# Patient Record
Sex: Female | Born: 1950 | Race: White | Hispanic: No | Marital: Single | Smoking: Never smoker
Health system: Southern US, Community
[De-identification: ages and names within clinical notes are randomized; demographics above are authoritative.]

## PROBLEM LIST (undated history)

## (undated) DIAGNOSIS — H9190 Unspecified hearing loss, unspecified ear: Secondary | ICD-10-CM

## (undated) DIAGNOSIS — C801 Malignant (primary) neoplasm, unspecified: Secondary | ICD-10-CM

## (undated) DIAGNOSIS — E079 Disorder of thyroid, unspecified: Secondary | ICD-10-CM

## (undated) DIAGNOSIS — I10 Essential (primary) hypertension: Secondary | ICD-10-CM

## (undated) DIAGNOSIS — F419 Anxiety disorder, unspecified: Secondary | ICD-10-CM

## (undated) DIAGNOSIS — G629 Polyneuropathy, unspecified: Secondary | ICD-10-CM

## (undated) HISTORY — PX: TUMOR REMOVAL: SHX12

## (undated) HISTORY — PX: CHOLECYSTECTOMY: SHX55

## (undated) HISTORY — PX: EYE SURGERY: SHX253

---

## 2004-12-10 ENCOUNTER — Emergency Department: Payer: Self-pay | Admitting: Emergency Medicine

## 2006-06-01 ENCOUNTER — Emergency Department: Payer: Self-pay | Admitting: Emergency Medicine

## 2006-06-21 ENCOUNTER — Emergency Department: Payer: Self-pay | Admitting: Emergency Medicine

## 2006-09-27 ENCOUNTER — Emergency Department: Payer: Self-pay | Admitting: Internal Medicine

## 2006-10-26 ENCOUNTER — Emergency Department: Payer: Self-pay | Admitting: Emergency Medicine

## 2006-12-07 ENCOUNTER — Emergency Department: Payer: Self-pay | Admitting: General Practice

## 2007-02-20 ENCOUNTER — Emergency Department: Payer: Self-pay | Admitting: Unknown Physician Specialty

## 2007-03-03 ENCOUNTER — Emergency Department: Payer: Self-pay | Admitting: Emergency Medicine

## 2007-04-26 ENCOUNTER — Emergency Department: Payer: Self-pay | Admitting: Emergency Medicine

## 2007-05-06 ENCOUNTER — Emergency Department: Payer: Self-pay | Admitting: Emergency Medicine

## 2007-05-19 ENCOUNTER — Emergency Department: Payer: Self-pay | Admitting: Emergency Medicine
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
## 2007-09-13 ENCOUNTER — Emergency Department: Payer: Self-pay | Admitting: Emergency Medicine

## 2008-03-04 ENCOUNTER — Emergency Department: Payer: Self-pay | Admitting: Emergency Medicine

## 2008-05-18 ENCOUNTER — Emergency Department: Payer: Self-pay | Admitting: Emergency Medicine
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
## 2009-01-10 ENCOUNTER — Emergency Department: Payer: Self-pay | Admitting: Emergency Medicine

## 2009-02-05 ENCOUNTER — Emergency Department: Payer: Self-pay | Admitting: Emergency Medicine

## 2009-05-08 ENCOUNTER — Emergency Department: Payer: Self-pay | Admitting: Emergency Medicine

## 2010-02-26 ENCOUNTER — Ambulatory Visit: Payer: Self-pay | Admitting: Internal Medicine

## 2010-03-04 ENCOUNTER — Emergency Department: Payer: Self-pay | Admitting: Emergency Medicine

## 2010-09-20 ENCOUNTER — Emergency Department: Payer: Self-pay | Admitting: Emergency Medicine

## 2011-03-06 ENCOUNTER — Emergency Department: Payer: Self-pay | Admitting: Emergency Medicine

## 2013-02-13 ENCOUNTER — Emergency Department: Payer: Self-pay | Admitting: Emergency Medicine

## 2013-05-02 ENCOUNTER — Emergency Department: Payer: Self-pay | Admitting: Internal Medicine

## 2013-12-23 ENCOUNTER — Emergency Department: Payer: Self-pay | Admitting: Emergency Medicine

## 2014-03-22 ENCOUNTER — Emergency Department: Payer: Self-pay | Admitting: Emergency Medicine

## 2014-06-03 ENCOUNTER — Emergency Department: Payer: Self-pay | Admitting: Emergency Medicine

## 2014-11-10 ENCOUNTER — Emergency Department: Payer: Self-pay | Admitting: Internal Medicine

## 2015-02-13 ENCOUNTER — Encounter: Payer: Self-pay | Admitting: *Deleted

## 2015-02-13 ENCOUNTER — Emergency Department
Admission: EM | Admit: 2015-02-13 | Discharge: 2015-02-13 | Disposition: A | Discharge status: Home / Self Care | Payer: Medicare Other | Attending: Emergency Medicine | Admitting: Emergency Medicine

## 2015-02-13 DIAGNOSIS — L237 Allergic contact dermatitis due to plants, except food: Secondary | ICD-10-CM | POA: Diagnosis not present

## 2015-02-13 DIAGNOSIS — X58XXXA Exposure to other specified factors, initial encounter: Secondary | ICD-10-CM | POA: Diagnosis not present

## 2015-02-13 DIAGNOSIS — Y9389 Activity, other specified: Secondary | ICD-10-CM | POA: Diagnosis not present

## 2015-02-13 DIAGNOSIS — Y9289 Other specified places as the place of occurrence of the external cause: Secondary | ICD-10-CM | POA: Insufficient documentation

## 2015-02-13 DIAGNOSIS — Y998 Other external cause status: Secondary | ICD-10-CM | POA: Insufficient documentation

## 2015-02-13 DIAGNOSIS — S0592XA Unspecified injury of left eye and orbit, initial encounter: Secondary | ICD-10-CM | POA: Diagnosis present

## 2015-02-13 DIAGNOSIS — S0502XA Injury of conjunctiva and corneal abrasion without foreign body, left eye, initial encounter: Secondary | ICD-10-CM | POA: Insufficient documentation

## 2015-02-13 MED ORDER — FLUORESCEIN SODIUM 1 MG OP STRP
ORAL_STRIP | OPHTHALMIC | Status: AC
  Filled 2015-02-13: qty 1

## 2015-02-13 MED ORDER — PREDNISONE 10 MG (21) PO TBPK: 10.0000 mg | ORAL_TABLET | Freq: Every day | ORAL | Status: DC

## 2015-02-13 MED ORDER — GENTAMICIN SULFATE 0.3 % OP SOLN: 2.0000 [drp] | OPHTHALMIC | Status: DC

## 2015-02-13 MED ORDER — TETRACAINE HCL 0.5 % OP SOLN
OPHTHALMIC | Status: AC
  Filled 2015-02-13: qty 2

## 2015-02-13 NOTE — Discharge Instructions (Signed)
Corneal Abrasion The cornea is the clear covering at the front and center of the eye. When you look at the colored portion of the eye, you are looking through the cornea. It is a thin tissue made up of layers. The top layer is the most sensitive layer. A corneal abrasion happens if this layer is scratched or an injury causes it to come off.  HOME CARE  You may be given drops or a medicated cream. Use the medicine as told by your doctor.  A pressure patch may be put over the eye. If this is done, follow your doctor's instructions for when to remove the patch. Do not drive or use machines while the eye patch is on. Judging distances is hard to do with a patch on.  See your doctor for a follow-up exam if you are told to do so. It is very important that you keep this appointment. GET HELP IF:   You have pain, are sensitive to light, and have a scratchy feeling in one eye or both eyes.  Your pressure patch keeps getting loose. You can blink your eye under the patch.  You have fluid coming from your eye or the lids stick together in the morning.  You have the same symptoms in the morning that you did with the first abrasion. This could be days, weeks, or months after the first abrasion healed. MAKE SURE YOU:   Understand these instructions.  Will watch your condition.  Will get help right away if you are not doing well or get worse. Document Released: 02/17/2008 Document Revised: 06/21/2013 Document Reviewed: 05/08/2013 [facility] Patient Information 2015 [place], [place]. This information is not intended to replace advice given to you by your health care provider. Make sure you discuss any questions you have with your health care provider.   Poison [employer] Poison ivy is a inflammation of the skin (contact dermatitis) caused by touching the allergens on the leaves of the ivy plant following previous exposure to the plant. The rash usually appears 48 hours after exposure. The rash is usually bumps  (papules) or blisters (vesicles) in a linear pattern. Depending on your own sensitivity, the rash may simply cause redness and itching, or it may also progress to blisters which may break open. These must be well cared for to prevent secondary bacterial (germ) infection, followed by scarring. Keep any open areas dry, clean, dressed, and covered with an antibacterial ointment if needed. The eyes may also get puffy. The puffiness is worst in the morning and gets better as the day progresses. This dermatitis usually heals without scarring, within 2 to 3 weeks without treatment. HOME CARE INSTRUCTIONS  Thoroughly wash with soap and water as soon as you have been exposed to poison ivy. You have about one half hour to remove the plant resin before it will cause the rash. This washing will destroy the oil or antigen on the skin that is causing, or will cause, the rash. Be sure to wash under your fingernails as any plant resin there will continue to spread the rash. Do not rub skin vigorously when washing affected area. Poison ivy cannot spread if no oil from the plant remains on your body. A rash that has progressed to weeping sores will not spread the rash unless you have not washed thoroughly. It is also important to wash any clothes you have been wearing as these may carry active allergens. The rash will return if you wear the unwashed clothing, even several days later.  Avoidance of the plant in the future is the best measure. Poison ivy plant can be recognized by the number of leaves. Generally, poison ivy has three leaves with flowering branches on a single stem. Diphenhydramine may be purchased over the counter and used as needed for itching. Do not drive with this medication if it makes you drowsy.Ask your caregiver about medication for children. SEEK MEDICAL CARE IF:  Open sores develop.  Redness spreads beyond area of rash.  You notice purulent (pus-like) discharge.  You have increased pain.  Other  signs of infection develop (such as fever). Document Released: 08/28/2000 Document Revised: 11/23/2011 Document Reviewed: 02/08/2009 [facility] Patient Information 2015 [place], [place]. This information is not intended to replace advice given to you by your health care provider. Make sure you discuss any questions you have with your health care provider.   Take the prescription steroid as directed.  Continue to use OTC Caladryl or Aveeno Ant-Itch Lotion for itch relief.  Follow-up with your provider or consider Dr. Darrick Huntsman for your primary care needs.

## 2015-02-13 NOTE — ED Notes (Signed)
Assessment per PA 

## 2015-02-13 NOTE — ED Notes (Signed)
With assistance of sign language interpreter, Pt reports yesterday she was mowing and she thinks she may have gotten a piece of wood in it, c/o redness, watery, soreness in her left eye. Pt reports for about 1 weeks rash on right arm, thinks it may be poison ivy. Has been using caladryl without relief.

## 2015-02-13 NOTE — ED Notes (Signed)
PT came in to the ED with c/o pain and burning in the left eye . She says that while she was mowing something jumped up and hit her in the eye. She states the she had pain and burning overnight but it is getting better now.she took a picture of the eye when it hit her and it looks looks red.   Pt also states that she feels a burning sensation when she looks towards her nose with the left eye.   Pt also has also has a  rash on her  Left forearm, which she thinks is due to a poison ivy and it started on Saturday and has been itching since then.

## 2015-02-14 NOTE — ED Provider Notes (Signed)
[facility] Emergency Department Provider Note ____________________________________________  Time seen: 1655  I have reviewed the triage vital signs and the nursing notes.  HISTORY  Chief Complaint Eye Injury  History limited by hearing impairment. ASL interpreter present for interview and exam.  HPI Robin Stanley is a 64 y.o. female who complains of foreign body sensation in left eye irritation after cutting the grass yesterday. She washed the eye in the shower yesterday, but continues to note burning sensation to the eye and a gritty sensation as well. She denies vision change, discharge, crusting, matting, or nausea. She also notes irritation to the right wrist for about 2 weeks after doing work and coming into contact with poison ivy.  No past medical history on file.  There are no active problems to display for this patient.  No past surgical history on file.  Current Outpatient Rx  Name  Route  Sig  Dispense  Refill  . gentamicin (GARAMYCIN) 0.3 % ophthalmic solution   Right Eye   Place 2 drops into the right eye every 4 (four) hours.   5 mL   0   . predniSONE (STERAPRED UNI-PAK 21 TAB) 10 MG (21) TBPK tablet   Oral   Take 1 tablet (10 mg total) by mouth daily.   21 tablet   0    Allergies Review of patient's allergies indicates no known allergies.  No family history on file.  Social History History  Substance Use Topics  . Smoking status: Never Smoker   . Smokeless tobacco: Not on file  . Alcohol Use: Yes   Review of Systems  Constitutional: Negative for fever. Eyes: Positive for left eye irritation.  Negative for visual changes. ENT: Negative for sore throat. Cardiovascular: Negative for chest pain. Respiratory: Negative for shortness of breath. Gastrointestinal: Negative for abdominal pain, vomiting and diarrhea. Genitourinary: Negative for dysuria. Musculoskeletal: Negative for back pain. Skin: Positive for  rash. Neurological: Negative for headaches, focal weakness or numbness. ____________________________________________  PHYSICAL EXAM:  VITAL SIGNS: ED Triage Vitals  Enc Vitals Group     BP 02/13/15 1601 161/86 mmHg     Pulse Rate 02/13/15 1601 66     Resp 02/13/15 1601 16     Temp 02/13/15 1601 97.6 F (36.4 C)     Temp Source 02/13/15 1601 Oral     SpO2 02/13/15 1601 97 %     Weight 02/13/15 1601 189 lb (85.73 kg)     Height 02/13/15 1601  (1.626 m)     Head Cir --      Peak Flow --      Pain Score 02/13/15 1602 5     Pain Loc --      Pain Edu? --      Excl. in GC? --    Constitutional: Alert and oriented. Well appearing and in no distress. HEENT: Normocephalic and atraumatic. Conjunctivae are normal. PERRL. Normal extraocular movements. Left eye with local conjunctivitis at the medial limbic border. Fluorescein dye uptake consistent with a small, focal corneal abrasion on the left eye at 7 o'clock on the cornea. No congestion/rhinnorhea. Mucous membranes are moist. Respiratory: Normal respiratory effort. Musculoskeletal: Nontender with normal range of motion in all extremities. No lower extremity tenderness nor edema. Neurologic:  No gross focal neurologic deficits are appreciated. Skin:  Skin is warm, dry and intact. Linear collection of blisters on the right wrist consistent with poison ivy. Psychiatric: Mood and affect are normal. Patient exhibits appropriate insight  and judgment. ____________________________________________  INITIAL IMPRESSION / ASSESSMENT AND PLAN / ED COURSE  Superficial corneal abrasion and contact dermatitis due to poison ivy. Treatment with antibiotics and steroids, respectively. ____________________________________________  FINAL CLINICAL IMPRESSION(S) / ED DIAGNOSES  Final diagnoses:  Corneal abrasion, left, initial encounter  Contact dermatitis due to poison ivy     Lissa HoardJenise V Bacon Nechelle Petrizzo, PA-C 02/14/15 0114  Sharman CheekPhillip Stafford,  MD 02/15/15 [phone]

## 2015-05-11 ENCOUNTER — Emergency Department
Admission: EM | Admit: 2015-05-11 | Discharge: 2015-05-11 | Disposition: A | Discharge status: Home / Self Care | Payer: Federal, State, Local not specified - PPO | Attending: Emergency Medicine | Admitting: Emergency Medicine

## 2015-05-11 ENCOUNTER — Encounter: Payer: Self-pay | Admitting: Emergency Medicine

## 2015-05-11 DIAGNOSIS — R21 Rash and other nonspecific skin eruption: Secondary | ICD-10-CM | POA: Diagnosis present

## 2015-05-11 DIAGNOSIS — L237 Allergic contact dermatitis due to plants, except food: Secondary | ICD-10-CM | POA: Insufficient documentation

## 2015-05-11 MED ORDER — CALAMINE EX LOTN: 1.0000 "application " | TOPICAL_LOTION | CUTANEOUS | Status: AC | PRN

## 2015-05-11 MED ORDER — HYDROXYZINE HCL 25 MG PO TABS: 25.0000 mg | ORAL_TABLET | Freq: Three times a day (TID) | ORAL | Status: DC | PRN

## 2015-05-11 MED ORDER — PREDNISONE 10 MG (21) PO TBPK: ORAL_TABLET | ORAL | Status: DC

## 2015-05-11 NOTE — ED Notes (Signed)
VRI used, Robin Stanley, [id number]

## 2015-05-11 NOTE — ED Provider Notes (Signed)
[facility] Emergency Department Provider Note ____________________________________________  Time seen: Approximately 12:58 PM  I have reviewed the triage vital signs and the nursing notes.   HISTORY  Chief Complaint Rash   HPI Robin Stanley is a 64 y.o. female presents to the emergency department for evaluation of a rash to the lower extremities. She states that she believes it's related to poison oak in her yard. She's been mowing and believes that she came into contact with it. She is using calamine lotion and Caladryl. She states that the Rhame works better than the calamine lotion and would like to have a prescription.   History reviewed. No pertinent past medical history.  There are no active problems to display for this patient.   History reviewed. No pertinent past surgical history.  Current Outpatient Rx  Name  Route  Sig  Dispense  Refill  . calamine lotion   Topical   Apply 1 application topically as needed for itching.   120 mL   0   . gentamicin (GARAMYCIN) 0.3 % ophthalmic solution   Right Eye   Place 2 drops into the right eye every 4 (four) hours.   5 mL   0   . hydrOXYzine (ATARAX/VISTARIL) 25 MG tablet   Oral   Take 1 tablet (25 mg total) by mouth 3 (three) times daily as needed.   30 tablet   0   . predniSONE (STERAPRED UNI-PAK 21 TAB) 10 MG (21) TBPK tablet      Take 6 tablets on day 1 Take 5 tablets on day 2 Take 4 tablets on day 3 Take 3 tablets on day 4 Take 2 tablets on day 5 Take 1 tablet on day 6   21 tablet   0     Allergies Review of patient's allergies indicates no known allergies.  No family history on file.  Social History Social History  Substance Use Topics  . Smoking status: Never Smoker   . Smokeless tobacco: None  . Alcohol Use: Yes    Review of Systems   Constitutional: No fever/chills Eyes: No visual changes. ENT: No congestion or rhinorrhea Cardiovascular: Denies  chest pain. Respiratory: Denies shortness of breath. Gastrointestinal: No abdominal pain.  No nausea, no vomiting.  No diarrhea.  No constipation. Genitourinary: Negative for dysuria. Musculoskeletal: Negative for back pain. Skin: Rash to the legs and feet Neurological: Negative for headaches, focal weakness or numbness.  10-point ROS otherwise negative.  ____________________________________________   PHYSICAL EXAM:  VITAL SIGNS: ED Triage Vitals  Enc Vitals Group     BP 05/11/15 1116 137/73 mmHg     Pulse Rate 05/11/15 1116 85     Resp 05/11/15 1116 16     Temp 05/11/15 1116 97.5 F (36.4 C)     Temp Source 05/11/15 1116 Oral     SpO2 05/11/15 1116 96 %     Weight --      Height 05/11/15 1116 5\' 4"  (1.626 m)     Head Cir --      Peak Flow --      Pain Score --      Pain Loc --      Pain Edu? --      Excl. in GC? --     Constitutional: Alert and oriented. Well appearing and in no acute distress. Eyes: Conjunctivae are normal. PERRL. EOMI. Head: Atraumatic. Nose: No congestion/rhinnorhea. Mouth/Throat: Mucous membranes are moist.  Oropharynx non-erythematous. No oral lesions. Neck: No stridor.  Cardiovascular: Normal rate, regular rhythm.  Good peripheral circulation. Respiratory: Normal respiratory effort.  No retractions. Lungs CTAB. Gastrointestinal: Soft and nontender. No distention. No abdominal bruits.  Musculoskeletal: No lower extremity tenderness nor edema.  No joint effusions. Neurologic:  Normal speech and language. No gross focal neurologic deficits are appreciated. Speech is normal. No gait instability. Skin:  Rash to bilateral feet--covered in calamine lotion and excoriated, and therefore unable to give an accurate description.; Negative for petechiae.  Psychiatric: Mood and affect are normal. Speech and behavior are normal.  ____________________________________________   LABS (all labs ordered are listed, but only abnormal results are  displayed)  Labs Reviewed - No data to display ____________________________________________  EKG   ____________________________________________  RADIOLOGY   ____________________________________________   PROCEDURES  Procedure(s) performed: None ____________________________________________   INITIAL IMPRESSION / ASSESSMENT AND PLAN / ED COURSE  Pertinent labs & imaging results that were available during my care of the patient were reviewed by me and considered in my medical decision making (see chart for details).  Patient was advised that Caladryl is an over-the-counter lotion. She was advised to follow-up with her primary care provider for symptoms that are not improving over the next several days. She was advised to return to the emergency department for symptoms that change or worsen if she is unable schedule an appointment. ____________________________________________   FINAL CLINICAL IMPRESSION(S) / ED DIAGNOSES  Final diagnoses:  Contact dermatitis due to poison oak       Chinita Pester, FNP 05/11/15 1303  Governor Rooks, MD 05/11/15 1400

## 2015-05-11 NOTE — ED Notes (Signed)
PAGED IN LOBBY. NO ANSWER. 

## 2015-05-11 NOTE — Discharge Instructions (Signed)
Contact Dermatitis °Contact dermatitis is a rash that happens when something touches the skin. You touched something that irritates your skin, or you have allergies to something you touched. °HOME CARE  °· Avoid the thing that caused your rash. °· Keep your rash away from hot water, soap, sunlight, chemicals, and other things that might bother it. °· Do not scratch your rash. °· You can take cool baths to help stop itching. °· Only take medicine as told by your doctor. °· Keep all doctor visits as told. °GET HELP RIGHT AWAY IF:  °· Your rash is not better after 3 days. °· Your rash gets worse. °· Your rash is puffy (swollen), tender, red, sore, or warm. °· You have problems with your medicine. °MAKE SURE YOU:  °· Understand these instructions. °· Will watch your condition. °· Will get help right away if you are not doing well or get worse. °Document Released: 06/28/2009 Document Revised: 11/23/2011 Document Reviewed: 02/03/2011 °ExitCare® Patient Information ©2015 [employer], [employer]. This information is not intended to replace advice given to you by your health care provider. Make sure you discuss any questions you have with your health care provider. ° °

## 2015-05-11 NOTE — ED Notes (Signed)
Patient presents to the ED with poison ivy to her legs.  Patient is requesting a prescription for poison ivy medication.  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

## 2015-09-10 ENCOUNTER — Emergency Department
Admission: EM | Admit: 2015-09-10 | Discharge: 2015-09-10 | Disposition: A | Discharge status: Home / Self Care | Payer: Federal, State, Local not specified - PPO | Attending: Emergency Medicine | Admitting: Emergency Medicine

## 2015-09-10 ENCOUNTER — Encounter: Payer: Self-pay | Admitting: Emergency Medicine

## 2015-09-10 DIAGNOSIS — Z792 Long term (current) use of antibiotics: Secondary | ICD-10-CM | POA: Diagnosis not present

## 2015-09-10 DIAGNOSIS — F419 Anxiety disorder, unspecified: Secondary | ICD-10-CM | POA: Diagnosis not present

## 2015-09-10 DIAGNOSIS — J320 Chronic maxillary sinusitis: Secondary | ICD-10-CM

## 2015-09-10 DIAGNOSIS — R0981 Nasal congestion: Secondary | ICD-10-CM | POA: Diagnosis present

## 2015-09-10 HISTORY — DX: Anxiety disorder, unspecified: F41.9

## 2015-09-10 MED ORDER — FLUTICASONE PROPIONATE 50 MCG/ACT NA SUSP: 1.0000 | Freq: Two times a day (BID) | NASAL | Status: DC

## 2015-09-10 MED ORDER — ALPRAZOLAM 0.5 MG PO TABS
0.5000 mg | ORAL_TABLET | Freq: Once | ORAL | Status: AC
  Administered 2015-09-10: 0.5 mg via ORAL
  Filled 2015-09-10: qty 1

## 2015-09-10 MED ORDER — NAPROXEN 500 MG PO TABS: 500.0000 mg | ORAL_TABLET | Freq: Two times a day (BID) | ORAL | Status: DC

## 2015-09-10 MED ORDER — CETIRIZINE HCL 10 MG PO TABS: 10.0000 mg | ORAL_TABLET | Freq: Every day | ORAL | Status: AC
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
## 2015-09-10 NOTE — Discharge Instructions (Signed)
Panic Attacks °Panic attacks are sudden, short-lived surges of severe anxiety, fear, or discomfort. They may occur for no reason when you are relaxed, when you are anxious, or when you are sleeping. Panic attacks may occur for a number of reasons:  °· Healthy people occasionally have panic attacks in extreme, life-threatening situations, such as war or natural disasters. Normal anxiety is a protective mechanism of the body that helps us react to danger (fight or flight response). °· Panic attacks are often seen with anxiety disorders, such as panic disorder, social anxiety disorder, generalized anxiety disorder, and phobias. Anxiety disorders cause excessive or uncontrollable anxiety. They may interfere with your relationships or other life activities. °· Panic attacks are sometimes seen with other mental illnesses, such as depression and posttraumatic stress disorder. °· Certain medical conditions, prescription medicines, and drugs of abuse can cause panic attacks. °SYMPTOMS  °Panic attacks start suddenly, peak within 20 minutes, and are accompanied by four or more of the following symptoms: °· Pounding heart or fast heart rate (palpitations). °· Sweating. °· Trembling or shaking. °· Shortness of breath or feeling smothered. °· Feeling choked. °· Chest pain or discomfort. °· Nausea or strange feeling in your stomach. °· Dizziness, light-headedness, or feeling like you will faint. °· Chills or hot flushes. °· Numbness or tingling in your lips or hands and feet. °· Feeling that things are not real or feeling that you are not yourself. °· Fear of losing control or going crazy. °· Fear of dying. °Some of these symptoms can mimic serious medical conditions. For example, you may think you are having a heart attack. Although panic attacks can be very scary, they are not life threatening. °DIAGNOSIS  °Panic attacks are diagnosed through an assessment by your health care provider. Your health care provider will ask  questions about your symptoms, such as where and when they occurred. Your health care provider will also ask about your medical history and use of alcohol and drugs, including prescription medicines. Your health care provider may order blood tests or other studies to rule out a serious medical condition. Your health care provider may refer you to a mental health professional for further evaluation. °TREATMENT  °· Most healthy people who have one or two panic attacks in an extreme, life-threatening situation will not require treatment. °· The treatment for panic attacks associated with anxiety disorders or other mental illness typically involves counseling with a mental health professional, medicine, or a combination of both. Your health care provider will help determine what treatment is best for you. °· Panic attacks due to physical illness usually go away with treatment of the illness. If prescription medicine is causing panic attacks, talk with your health care provider about stopping the medicine, decreasing the dose, or substituting another medicine. °· Panic attacks due to alcohol or drug abuse go away with abstinence. Some adults need professional help in order to stop drinking or using drugs. °HOME CARE INSTRUCTIONS  °· Take all medicines as directed by your health care provider.   °· Schedule and attend follow-up visits as directed by your health care provider. It is important to keep all your appointments. °SEEK MEDICAL CARE IF: °· You are not able to take your medicines as prescribed. °· Your symptoms do not improve or get worse. °SEEK IMMEDIATE MEDICAL CARE IF:  °· You experience panic attack symptoms that are different than your usual symptoms. °· You have serious thoughts about hurting yourself or others. °· You are taking medicine for panic attacks and   have a serious side effect. MAKE SURE YOU:  Understand these instructions.  Will watch your condition.  Will get help right away if you are not  doing well or get worse.   This information is not intended to replace advice given to you by your health care provider. Make sure you discuss any questions you have with your health care provider.   Document Released: 08/31/2005 Document Revised: 09/05/2013 Document Reviewed: 04/14/2013 Elsevier Interactive Patient Education 2016 Else[employer] Inc.  Sinusitis, Adult Sinusitis is redness, soreness, and inflammation of the paranasal sinuses. Paranasal sinuses are air pockets within the bones of your face. They are located beneath your eyes, in the middle of your forehead, and above your eyes. In healthy paranasal sinuses, mucus is able to drain out, and air is able to circulate through them by way of your nose. However, when your paranasal sinuses are inflamed, mucus and air can become trapped. This can allow bacteria and other germs to grow and cause infection. Sinusitis can develop quickly and last only a short time (acute) or continue over a long period (chronic). Sinusitis that lasts for more than 12 weeks is considered chronic. CAUSES Causes of sinusitis include:  Allergies.  Structural abnormalities, such as displacement of the cartilage that separates your nostrils (deviated septum), which can decrease the air flow through your nose and sinuses and affect sinus drainage.  Functional abnormalities, such as when the small hairs (cilia) that line your sinuses and help remove mucus do not work properly or are not present. SIGNS AND SYMPTOMS Symptoms of acute and chronic sinusitis are the same. The primary symptoms are pain and pressure around the affected sinuses. Other symptoms include:  Upper toothache.  Earache.  Headache.  Bad breath.  Decreased sense of smell and taste.  A cough, which worsens when you are lying flat.  Fatigue.  Fever.  Thick drainage from your nose, which often is green and may contain pus (purulent).  Swelling and warmth over the affected  sinuses. DIAGNOSIS Your health care provider will perform a physical exam. During your exam, your health care provider may perform any of the following to help determine if you have acute sinusitis or chronic sinusitis:  Look in your nose for signs of abnormal growths in your nostrils (nasal polyps).  Tap over the affected sinus to check for signs of infection.  View the inside of your sinuses using an imaging device that has a light attached (endoscope). If your health care provider suspects that you have chronic sinusitis, one or more of the following tests may be recommended:  Allergy tests.  Nasal culture. A sample of mucus is taken from your nose, sent to a lab, and screened for bacteria.  Nasal cytology. A sample of mucus is taken from your nose and examined by your health care provider to determine if your sinusitis is related to an allergy. TREATMENT Most cases of acute sinusitis are related to a viral infection and will resolve on their own within 10 days. Sometimes, medicines are prescribed to help relieve symptoms of both acute and chronic sinusitis. These may include pain medicines, decongestants, nasal steroid sprays, or saline sprays. However, for sinusitis related to a bacterial infection, your health care provider will prescribe antibiotic medicines. These are medicines that will help kill the bacteria causing the infection. Rarely, sinusitis is caused by a fungal infection. In these cases, your health care provider will prescribe antifungal medicine. For some cases of chronic sinusitis, surgery is needed. Generally, these are  cases in which sinusitis recurs more than 3 times per year, despite other treatments. HOME CARE INSTRUCTIONS  Drink plenty of water. Water helps thin the mucus so your sinuses can drain more easily.  Use a humidifier.  Inhale steam 3-4 times a day (for example, sit in the bathroom with the shower running).  Apply a warm, moist washcloth to your face  3-4 times a day, or as directed by your health care provider.  Use saline nasal sprays to help moisten and clean your sinuses.  Take medicines only as directed by your health care provider.  If you were prescribed either an antibiotic or antifungal medicine, finish it all even if you start to feel better. SEEK IMMEDIATE MEDICAL CARE IF:  You have increasing pain or severe headaches.  You have nausea, vomiting, or drowsiness.  You have swelling around your face.  You have vision problems.  You have a stiff neck.  You have difficulty breathing.   This information is not intended to replace advice given to you by your health care provider. Make sure you discuss any questions you have with your health care provider.   Document Released: 08/31/2005 Document Revised: 09/21/2014 Document Reviewed: 09/15/2011 Elsevier Interactive Patient Education [employer]

## 2015-09-10 NOTE — ED Notes (Signed)
Pt denies pain other than chronic knee pain (mild and not at this time)

## 2015-09-10 NOTE — ED Provider Notes (Signed)
[facility] Emergency Department Provider Note  ____________________________________________  Time seen: Approximately 6:51 PM  I have reviewed the triage vital signs and the nursing notes.   HISTORY  Chief Complaint Nasal Congestion  Patient is deaf and an interpreter is used.  HPI Elodie Lyndia Bury is a 64 y.o. female who presents to emergency department for complaint of nasal congestion, nasal pressure, and anxiety. Patient states that she went to [facility] to pick up over-the-counter medications to treat same. At that time she noticed somebody following her around the emergency department. Patient is deaf and is unable to communicate with other shoppers or staff at [facility]. Patient states she began to have an anxiety/panic attack and accidentally placed some of the over-the-counter medication into her purse. Patient went to the checkout line and 8 her other purchases but during departure from [place] she was approached by store security and accused of theft. She states that police were involved increasing her anxiety. Patient presents emergency department for prescription for symptoms as well as a doctor's note to explain situation so that she can get her medications filled at [facility] Patient endorses nasal congestion, sinus pressure 2 days. Patient has a history of chronic sinusitis.   Past Medical History  Diagnosis Date  . Anxiety     There are no active problems to display for this patient.   Past Surgical History  Procedure Laterality Date  . Eye surgery      Current Outpatient Rx  Name  Route  Sig  Dispense  Refill  . calamine lotion   Topical   Apply 1 application topically as needed for itching.   120 mL   0   . cetirizine (ZYRTEC) 10 MG tablet   Oral   Take 1 tablet (10 mg total) by mouth daily.   30 tablet   0   . fluticasone (FLONASE) 50 MCG/ACT nasal spray   Each Nare   Place 1 spray into both nostrils 2 (two) times daily.  16 g   0   . gentamicin (GARAMYCIN) 0.3 % ophthalmic solution   Right Eye   Place 2 drops into the right eye every 4 (four) hours.   5 mL   0   . hydrOXYzine (ATARAX/VISTARIL) 25 MG tablet   Oral   Take 1 tablet (25 mg total) by mouth 3 (three) times daily as needed.   30 tablet   0   . naproxen (NAPROSYN) 500 MG tablet   Oral   Take 1 tablet (500 mg total) by mouth 2 (two) times daily with a meal.   60 tablet   0   . predniSONE (STERAPRED UNI-PAK 21 TAB) 10 MG (21) TBPK tablet      Take 6 tablets on day 1 Take 5 tablets on day 2 Take 4 tablets on day 3 Take 3 tablets on day 4 Take 2 tablets on day 5 Take 1 tablet on day 6   21 tablet   0     Allergies Review of patient's allergies indicates no known allergies.  No family history on file.  Social History Social History  Substance Use Topics  . Smoking status: Never Smoker   . Smokeless tobacco: None  . Alcohol Use: Yes    Review of Systems Constitutional: No fever/chills Eyes: No visual changes. ENT: No sore throat. Endorses nasal congestion. Endorses sinus pressure. Cardiovascular: Denies chest pain. Respiratory: Denies shortness of breath. Gastrointestinal: No abdominal pain.  No nausea, no vomiting.  No diarrhea.  No constipation. Genitourinary: Negative for dysuria. Musculoskeletal: Negative for back pain. Skin: Negative for rash. Neurological: Negative for headaches, focal weakness or numbness.  10-point ROS otherwise negative.  ____________________________________________   PHYSICAL EXAM:  VITAL SIGNS: ED Triage Vitals  Enc Vitals Group     BP 09/10/15 1737 158/83 mmHg     Pulse Rate 09/10/15 1737 76     Resp 09/10/15 1737 20     Temp 09/10/15 1737 97.6 F (36.4 C)     Temp Source 09/10/15 1737 Oral     SpO2 09/10/15 1737 98 %     Weight 09/10/15 1737 170 lb (77.111 kg)     Height 09/10/15 1737 5\' 3"  (1.6 m)     Head Cir --      Peak Flow --      Pain Score --      Pain Loc --       Pain Edu? --      Excl. in GC? --     Constitutional: Alert and oriented. Well appearing and in no acute distress. Eyes: Conjunctivae are normal. PERRL. EOMI. Head: Atraumatic. Nose: Mild clear congestion/rhinnorhea. Turbinates are boggy. Frontal and maxillary sinuses are not tender to percussion. Mouth/Throat: Mucous membranes are moist.  Oropharynx non-erythematous. Neck: No stridor.   Hematological/Lymphatic/Immunilogical: No cervical lymphadenopathy. Cardiovascular: Normal rate, regular rhythm. Grossly normal heart sounds.  Good peripheral circulation. Respiratory: Normal respiratory effort.  No retractions. Lungs CTAB. Gastrointestinal: Soft and nontender. No distention. No abdominal bruits. No CVA tenderness. Musculoskeletal: No lower extremity tenderness nor edema.  No joint effusions. Neurologic:  Normal speech and language. No gross focal neurologic deficits are appreciated. No gait instability. Skin:  Skin is warm, dry and intact. No rash noted. Psychiatric: Mood and affect are normal. Speech and behavior are normal.  ____________________________________________   LABS (all labs ordered are listed, but only abnormal results are displayed)  Labs Reviewed - No data to display ____________________________________________  EKG   ____________________________________________  RADIOLOGY   ____________________________________________   PROCEDURES  Procedure(s) performed: None  Critical Care performed: No   Medications  ALPRAZolam (XANAX) tablet 0.5 mg (0.5 mg Oral Given 09/10/15 1923)    ____________________________________________   INITIAL IMPRESSION / ASSESSMENT AND PLAN / ED COURSE  Pertinent labs & imaging results that were available during my care of the patient were reviewed by me and considered in my medical decision making (see chart for details).  Patient presents emergency department with a complaint of sinus pressure, congestion, and anxiety.  [facility] to obtain over-the-counter medications. Patient felt threatened in the store and accidentally put one bottle medication in her purse. Patient was confronted by store security as well as [employer]. Patient is having anxiety from the incident that [employer]. Patient is given 0.5 mg of Xanax here in the emergency department with good symptomatic control. Patient will be given prescriptions for chronic sinusitis. There is no signs of infection at this time. [facility] also [facility] along [facility] prescriptions. Patient verbalizes understanding of her diagnosis, the treatment plan, and plan to include note for [facility].   New Prescriptions   CETIRIZINE (ZYRTEC) 10 MG TABLET    Take 1 tablet (10 mg total) by mouth daily.   FLUTICASONE (FLONASE) 50 MCG/ACT NASAL SPRAY    Place 1 spray into both nostrils 2 (two) times daily.   NAPROXEN (NAPROSYN) 500 MG TABLET    Take  1 tablet (500 mg total) by mouth 2 (two) times daily with a meal.    ____________________________________________   FINAL CLINICAL IMPRESSION(S) / ED DIAGNOSES  Final diagnoses:  Chronic maxillary sinusitis  Anxiety      Racheal Patches, PA-C 09/10/15 1956  Loleta Rose, MD 09/10/15 2111

## 2015-09-10 NOTE — ED Notes (Signed)
Per  Interpreter Jason NestJarvis # [phone] RID.   Pt states has had nasal congestion started yesterday. Went to [facility] today to get some medicine and while she was shopping she states a man was following her around and she got scared so she accidentally put some medicines into her purse without thinking and security approached her accusing her of stealing. Pt states they were unable to communicate with her due to  Not having an interpreter present and that made her very upset causing her to get very anxious. So pt came here to get a note saying that she needed medicines for sinuses so that [facility] does not think she was trying to steal.  Pt denies any thoughts of SI or HI. Pt with no acute distress noted.

## 2015-10-22 ENCOUNTER — Emergency Department
Admission: EM | Admit: 2015-10-22 | Discharge: 2015-10-22 | Disposition: A | Discharge status: Home / Self Care | Payer: Federal, State, Local not specified - PPO | Attending: Emergency Medicine | Admitting: Emergency Medicine

## 2015-10-22 ENCOUNTER — Encounter: Payer: Self-pay | Admitting: Emergency Medicine

## 2015-10-22 DIAGNOSIS — Z792 Long term (current) use of antibiotics: Secondary | ICD-10-CM | POA: Diagnosis not present

## 2015-10-22 DIAGNOSIS — Z7952 Long term (current) use of systemic steroids: Secondary | ICD-10-CM | POA: Insufficient documentation

## 2015-10-22 DIAGNOSIS — Z791 Long term (current) use of non-steroidal anti-inflammatories (NSAID): Secondary | ICD-10-CM | POA: Insufficient documentation

## 2015-10-22 DIAGNOSIS — Z79899 Other long term (current) drug therapy: Secondary | ICD-10-CM | POA: Insufficient documentation

## 2015-10-22 DIAGNOSIS — B349 Viral infection, unspecified: Secondary | ICD-10-CM | POA: Diagnosis not present

## 2015-10-22 DIAGNOSIS — J029 Acute pharyngitis, unspecified: Secondary | ICD-10-CM | POA: Diagnosis present

## 2015-10-22 DIAGNOSIS — H9391 Unspecified disorder of right ear: Secondary | ICD-10-CM | POA: Diagnosis not present

## 2015-10-22 DIAGNOSIS — B9789 Other viral agents as the cause of diseases classified elsewhere: Secondary | ICD-10-CM

## 2015-10-22 DIAGNOSIS — J028 Acute pharyngitis due to other specified organisms: Secondary | ICD-10-CM

## 2015-10-22 LAB — POCT RAPID STREP A: STREPTOCOCCUS, GROUP A SCREEN (DIRECT): NEGATIVE

## 2015-10-22 MED ORDER — FLUTICASONE PROPIONATE 50 MCG/ACT NA SUSP: 2.0000 | Freq: Every day | NASAL | Status: DC

## 2015-10-22 MED ORDER — MAGIC MOUTHWASH W/LIDOCAINE: 5.0000 mL | Freq: Four times a day (QID) | ORAL | Status: DC | PRN

## 2015-10-22 NOTE — ED Notes (Signed)
Pt arrived to the ED for complaints of sore throat, runny nose and cough without relieve with medication. Pt is AOx4 in no apparent distress. Pt is deaf and will require an interpreter.

## 2015-10-22 NOTE — ED Notes (Signed)
Pt discharged to home.  Friend driving.  Discharge instructions reviewed.  Verbalized understanding.  No questions or concerns at this time.  Teach back verified.  Pt in NAD.  No items left in ED.   

## 2015-10-22 NOTE — ED Provider Notes (Signed)
[facility] Emergency Department Provider Note  ____________________________________________  Time seen: Approximately 9:39 PM  I have reviewed the triage vital signs and the nursing notes.   HISTORY  Chief Complaint Cough; Sore Throat; and Nasal Congestion  Patient is deaf, ASL interpreter assisted throughout the visit  HPI Robin Stanley is a 65 y.o. female, NAD, presents to the emergency department with 3 day history of sore throat, subjective fevers, runny nose and congestion. Took 1000 mg Tylenol which assisted with sore throat. Has had mild cough but no pain with cough. Denies body aches. No known sick contacts.   Past Medical History  Diagnosis Date  . Anxiety     There are no active problems to display for this patient.   Past Surgical History  Procedure Laterality Date  . Eye surgery    . Cholecystectomy    . Tumor removal      Current Outpatient Rx  Name  Route  Sig  Dispense  Refill  . calamine lotion   Topical   Apply 1 application topically as needed for itching.   120 mL   0   . cetirizine (ZYRTEC) 10 MG tablet   Oral   Take 1 tablet (10 mg total) by mouth daily.   30 tablet   0   . fluticasone (FLONASE) 50 MCG/ACT nasal spray   Each Nare   Place 2 sprays into both nostrils daily.   16 g   0   . gentamicin (GARAMYCIN) 0.3 % ophthalmic solution   Right Eye   Place 2 drops into the right eye every 4 (four) hours.   5 mL   0   . hydrOXYzine (ATARAX/VISTARIL) 25 MG tablet   Oral   Take 1 tablet (25 mg total) by mouth 3 (three) times daily as needed.   30 tablet   0   . magic mouthwash w/lidocaine SOLN   Oral   Take 5 mLs by mouth 4 (four) times daily as needed for mouth pain.   240 mL   0     Please mix 60mL diphenhydramine, 60mL nystatin, 60 ...   . naproxen (NAPROSYN) 500 MG tablet   Oral   Take 1 tablet (500 mg total) by mouth 2 (two) times daily with a meal.   60 tablet   0   . predniSONE  (STERAPRED UNI-PAK 21 TAB) 10 MG (21) TBPK tablet      Take 6 tablets on day 1 Take 5 tablets on day 2 Take 4 tablets on day 3 Take 3 tablets on day 4 Take 2 tablets on day 5 Take 1 tablet on day 6   21 tablet   0     Allergies Review of patient's allergies indicates no known allergies.  History reviewed. No pertinent family history.  Social History Social History  Substance Use Topics  . Smoking status: Never Smoker   . Smokeless tobacco: None  . Alcohol Use: Yes     Review of Systems  Constitutional: The subjective fever. Positive chills Eyes: No visual changes. No discharge ENT: Positive sore throat, nasal congestion, runny nose. Cardiovascular: No chest pain. Respiratory: Positive cough. No shortness of breath. No wheezing. No chest congestion Musculoskeletal: Negative for myalgias Skin: Negative for rash. Neurological: Negative for headaches, focal weakness or numbness. 10-point ROS otherwise negative.  ____________________________________________   PHYSICAL EXAM:  VITAL SIGNS: ED Triage Vitals  Enc Vitals Group     BP 10/22/15 2035 142/95 mmHg  Pulse Rate 10/22/15 2035 85     Resp 10/22/15 2035 18     Temp 10/22/15 2035 98.6 F (37 C)     Temp Source 10/22/15 2035 Oral     SpO2 10/22/15 2035 97 %     Weight 10/22/15 2035 195 lb (88.451 kg)     Height 10/22/15 2035  (1.626 m)     Head Cir --      Peak Flow --      Pain Score 10/22/15 2042 10     Pain Loc --      Pain Edu? --      Excl. in GC? --     Constitutional: Alert and oriented. Well appearing and in no acute distress. Eyes: Conjunctivae are normal. PERRL. EOMI without pain.  Head: Atraumatic. ENT:      Ears: Right TM visualized with moderate serous effusion, no erythema, no bulging/retractions and without perforation. TMs visualized with no effusion, erythema, bulging/retractions and without perforation. Bilateral canals without erythema, swelling, discharge      Nose: Bilateral  trace clear rhinorrhea noted.      Mouth/Throat: Mucous membranes are moist. Clear postnasal drip. Mild pharyngeal injection without swelling, exudates Neck: Upper with full range of motion Hematological/Lymphatic/Immunilogical: No cervical lymphadenopathy. Cardiovascular: Normal rate, regular rhythm. Normal S1 and S2.   Respiratory: Normal respiratory effort without tachypnea or retractions. Lungs CTAB. Skin:  Skin is warm, dry and intact. No rash noted. Psychiatric: Mood and affect are normal.  ____________________________________________   LABS (all labs ordered are listed, but only abnormal results are displayed)  Labs Reviewed  POCT RAPID STREP A   ____________________________________________  EKG  None ____________________________________________  RADIOLOGY  None ____________________________________________    PROCEDURES  Procedure(s) performed: None    Medications - No data to display   ____________________________________________   INITIAL IMPRESSION / ASSESSMENT AND PLAN / ED COURSE  Pertinent lab results that were available during my care of the patient were reviewed by me and considered in my medical decision making (see chart for details).  Patient's diagnosis is consistent with viral infection. Patient will be discharged home with prescriptions for mouthwash to use 1 teaspoon swish and gargle as needed for throat pain. Patient is to follow up with her primary care provider if symptoms persist past this treatment course. Patient is given ED precautions to return to the ED for any worsening or new symptoms.    ____________________________________________  FINAL CLINICAL IMPRESSION(S) / ED DIAGNOSES  Final diagnoses:  Viral infection  Sore throat (viral)      NEW MEDICATIONS STARTED DURING THIS VISIT:  New Prescriptions   FLUTICASONE (FLONASE) 50 MCG/ACT NASAL SPRAY    Place 2 sprays into both nostrils daily.   MAGIC MOUTHWASH W/LIDOCAINE  SOLN    Take 5 mLs by mouth 4 (four) times daily as needed for mouth pain.         Hope Pigeon, PA-C 10/22/15 2228  Myrna Blazer, MD 10/22/15 [phone]

## 2015-10-22 NOTE — Discharge Instructions (Signed)
Pharyngitis °Pharyngitis is a sore throat (pharynx). There is redness, pain, and swelling of your throat. °HOME CARE  °· Drink enough fluids to keep your pee (urine) clear or pale yellow. °· Only take medicine as told by your doctor. °· You may get sick again if you do not take medicine as told. Finish your medicines, even if you start to feel better. °· Do not take aspirin. °· Rest. °· Rinse your mouth (gargle) with salt water (½ tsp of salt per 1 qt of water) every 1-2 hours. This will help the pain. °· If you are not at risk for choking, you can suck on hard candy or sore throat lozenges. °GET HELP IF: °· You have large, tender lumps on your neck. °· You have a rash. °· You cough up green, yellow-brown, or bloody spit. °GET HELP RIGHT AWAY IF:  °· You have a stiff neck. °· You drool or cannot swallow liquids. °· You throw up (vomit) or are not able to keep medicine or liquids down. °· You have very bad pain that does not go away with medicine. °· You have problems breathing (not from a stuffy nose). °MAKE SURE YOU:  °· Understand these instructions. °· Will watch your condition. °· Will get help right away if you are not doing well or get worse. °  °This information is not intended to replace advice given to you by your health care provider. Make sure you discuss any questions you have with your health care provider. °  °Document Released: 02/17/2008 Document Revised: 06/21/2013 Document Reviewed: 05/08/2013 °Elsevier Interactive Patient Education ©2016 Elsevier Inc. °Viral Infections °A viral infection can be caused by different types of viruses. Most viral infections are not serious and resolve on their own. However, some infections may cause severe symptoms and may lead to further complications. °SYMPTOMS °Viruses can frequently cause: °· Minor sore throat. °· Aches and pains. °· Headaches. °· Runny nose. °· Different types of rashes. °· Watery eyes. °· Tiredness. °· Cough. °· Loss of  appetite. °· Gastrointestinal infections, resulting in nausea, vomiting, and diarrhea. °These symptoms do not respond to antibiotics because the infection is not caused by bacteria. However, you might catch a bacterial infection following the viral infection. This is sometimes called a "superinfection." Symptoms of such a bacterial infection may include: °· Worsening sore throat with pus and difficulty swallowing. °· Swollen neck glands. °· Chills and a high or persistent fever. °· Severe headache. °· Tenderness over the sinuses. °· Persistent overall ill feeling (malaise), muscle aches, and tiredness (fatigue). °· Persistent cough. °· Yellow, green, or brown mucus production with coughing. °HOME CARE INSTRUCTIONS  °· Only take over-the-counter or prescription medicines for pain, discomfort, diarrhea, or fever as directed by your caregiver. °· Drink enough water and fluids to keep your urine clear or pale yellow. Sports drinks can provide valuable electrolytes, sugars, and hydration. °· Get plenty of rest and maintain proper nutrition. Soups and broths with crackers or rice are fine. °SEEK IMMEDIATE MEDICAL CARE IF:  °· You have severe headaches, shortness of breath, chest pain, neck pain, or an unusual rash. °· You have uncontrolled vomiting, diarrhea, or you are unable to keep down fluids. °· You or your child has an oral temperature above 102° F (38.9° C), not controlled by medicine. °· Your baby is older than 3 months with a rectal temperature of 102° F (38.9° C) or higher. °· Your baby is 3 months old or younger with a rectal temperature of 100.4° F (  F (38 C) or higher. MAKE SURE YOU:   Understand these instructions.  Will watch your condition.  Will get help right away if you are not doing well or get worse.   This information is not intended to replace advice given to you by your health care provider. Make sure you discuss any questions you have with your health care provider.   Document Released:  06/10/2005 Document Revised: 11/23/2011 Document Reviewed: 02/06/2015 Elsevier Interactive Patient Education [employer]

## 2016-01-25 ENCOUNTER — Emergency Department: Payer: Federal, State, Local not specified - PPO

## 2016-01-25 ENCOUNTER — Emergency Department
Admission: EM | Admit: 2016-01-25 | Discharge: 2016-01-25 | Disposition: A | Discharge status: Home / Self Care | Payer: Federal, State, Local not specified - PPO | Attending: Emergency Medicine | Admitting: Emergency Medicine

## 2016-01-25 ENCOUNTER — Encounter: Payer: Self-pay | Admitting: Emergency Medicine

## 2016-01-25 DIAGNOSIS — M19072 Primary osteoarthritis, left ankle and foot: Secondary | ICD-10-CM | POA: Diagnosis not present

## 2016-01-25 DIAGNOSIS — L258 Unspecified contact dermatitis due to other agents: Secondary | ICD-10-CM | POA: Insufficient documentation

## 2016-01-25 DIAGNOSIS — L255 Unspecified contact dermatitis due to plants, except food: Secondary | ICD-10-CM

## 2016-01-25 DIAGNOSIS — M79672 Pain in left foot: Secondary | ICD-10-CM | POA: Diagnosis present

## 2016-01-25 MED ORDER — MELOXICAM 7.5 MG PO TABS: 15.0000 mg | ORAL_TABLET | Freq: Every day | ORAL | Status: DC

## 2016-01-25 MED ORDER — HYDROXYZINE PAMOATE 25 MG PO CAPS: 25.0000 mg | ORAL_CAPSULE | Freq: Three times a day (TID) | ORAL | Status: DC | PRN

## 2016-01-25 NOTE — ED Notes (Signed)
L foot pain. States has history of same but worse after mowing lawn. Also poison ivy rash chest x 2 weeks.

## 2016-01-25 NOTE — Discharge Instructions (Signed)
Arthritis Arthritis is a term that is commonly used to refer to joint pain or joint disease. There are more than 100 types of arthritis. CAUSES The most common cause of this condition is wear and tear of a joint. Other causes include:  Gout.  Inflammation of a joint.  An infection of a joint.  Sprains and other injuries near the joint.  A drug reaction or allergic reaction. In some cases, the cause may not be known. SYMPTOMS The main symptom of this condition is pain in the joint with movement. Other symptoms include:  Redness, swelling, or stiffness at a joint.  Warmth coming from the joint.  Fever.  Overall feeling of illness. DIAGNOSIS This condition may be diagnosed with a physical exam and tests, including:  Blood tests.  Urine tests.  Imaging tests, such as MRI, X-rays, or a CT scan. Sometimes, fluid is removed from a joint for testing. TREATMENT Treatment for this condition may involve:  Treatment of the cause, if it is known.  Rest.  Raising (elevating) the joint.  Applying cold or hot packs to the joint.  Medicines to improve symptoms and reduce inflammation.  Injections of a steroid such as cortisone into the joint to help reduce pain and inflammation. Depending on the cause of your arthritis, you may need to make lifestyle changes to reduce stress on your joint. These changes may include exercising more and losing weight. HOME CARE INSTRUCTIONS Medicines  Take over-the-counter and prescription medicines only as told by your health care provider.  Do not take aspirin to relieve pain if gout is suspected. Activities  Rest your joint if told by your health care provider. Rest is important when your disease is active and your joint feels painful, swollen, or stiff.  Avoid activities that make the pain worse. It is important to balance activity with rest.  Exercise your joint regularly with range-of-motion exercises as told by your health care  provider. Try doing low-impact exercise, such as:  Swimming.  Water aerobics.  Biking.  Walking. Joint Care  If your joint is swollen, keep it elevated if told by your health care provider.  If your joint feels stiff in the morning, try taking a warm shower.  If directed, apply heat to the joint. If you have diabetes, do not apply heat without permission from your health care provider.  Put a towel between the joint and the hot pack or heating pad.  Leave the heat on the area for 20-30 minutes.  If directed, apply ice to the joint:  Put ice in a plastic bag.  Place a towel between your skin and the bag.  Leave the ice on for 20 minutes, 2-3 times per day.  Keep all follow-up visits as told by your health care provider. This is important. SEEK MEDICAL CARE IF:  The pain gets worse.  You have a fever. SEEK IMMEDIATE MEDICAL CARE IF:  You develop severe joint pain, swelling, or redness.  Many joints become painful and swollen.  You develop severe back pain.  You develop severe weakness in your leg.  You cannot control your bladder or bowels.   This information is not intended to replace advice given to you by your health care provider. Make sure you discuss any questions you have with your health care provider.   Document Released: 10/08/2004 Document Revised: 05/22/2015 Document Reviewed: 11/26/2014 Elsevier Interactive Patient Education 2016 [employer].  [employer] Poison ivy is a rash caused by touching the leaves of the  poison ivy plant. The rash often shows up 48 hours later. You might just have bumps, redness, and itching. Sometimes, blisters appear and break open. Your eyes may get puffy (swollen). Poison ivy often heals in 2 to 3 weeks without treatment. HOME CARE  If you touch poison ivy:  Wash your skin with soap and water right away. Wash under your fingernails. Do not rub the skin very hard.  Wash any clothes you were wearing.  Avoid poison  ivy in the future. Poison ivy has 3 leaves on a stem.  Use medicine to help with itching as told by your doctor. Do not drive when you take this medicine.  Keep open sores dry, clean, and covered with a bandage and medicated cream, if needed.  Ask your doctor about medicine for children. GET HELP RIGHT AWAY IF:  You have open sores.  Redness spreads beyond the area of the rash.  There is yellowish white fluid (pus) coming from the rash.  Pain gets worse.  You have a temperature by mouth above 102 F (38.9 C), not controlled by medicine. MAKE SURE YOU:  Understand these instructions.  Will watch your condition.  Will get help right away if you are not doing well or get worse.   This information is not intended to replace advice given to you by your health care provider. Make sure you discuss any questions you have with your health care provider.   Document Released: 10/03/2010 Document Revised: 11/23/2011 Document Reviewed: 02/06/2015 Elsevier Interactive Patient Education [employer] Elsevier Inc.

## 2016-01-25 NOTE — ED Provider Notes (Signed)
[facility] Emergency Department Provider Note  ____________________________________________  Time seen: Approximately 2:04 PM  I have reviewed the triage vital signs and the nursing notes.   HISTORY  Chief Complaint Foot Pain and Rash  History, physical exam and discharge was completed in the presence of an American sign language interpreter via electronic platform.  HPI Robin Stanley is a 65 y.o. female , NAD, presents to the emergency department with one-day history of left foot pain and 2 week history of chest rash. Patient states she has had off and on left foot pain for some time. States it gets worse with activity. Also notes at its worse in the mornings and presents to the day. Can feel like a burning. Has not had any injuries, traumas, falls to injure the foot. Denies any bruising, swelling, redness, skin sores. Also notes that she has had in itchy rash about her chest for about 2 weeks. States her son was working in her yard and she feels she was exposed to poison ivy. Has been using an over-the-counter cream which has seemed to help but she continues to itch. Denies any rash about any other part of her body. Has not had a difficulty breathing or swallowing due to the exposure.   Past Medical History  Diagnosis Date  . Anxiety     There are no active problems to display for this patient.   Past Surgical History  Procedure Laterality Date  . Eye surgery    . Cholecystectomy    . Tumor removal      Current Outpatient Rx  Name  Route  Sig  Dispense  Refill  . calamine lotion   Topical   Apply 1 application topically as needed for itching.   120 mL   0   . cetirizine (ZYRTEC) 10 MG tablet   Oral   Take 1 tablet (10 mg total) by mouth daily.   30 tablet   0   . fluticasone (FLONASE) 50 MCG/ACT nasal spray   Each Nare   Place 2 sprays into both nostrils daily.   16 g   0   . gentamicin (GARAMYCIN) 0.3 % ophthalmic solution    Right Eye   Place 2 drops into the right eye every 4 (four) hours.   5 mL   0   . hydrOXYzine (ATARAX/VISTARIL) 25 MG tablet   Oral   Take 1 tablet (25 mg total) by mouth 3 (three) times daily as needed.   30 tablet   0   . hydrOXYzine (VISTARIL) 25 MG capsule   Oral   Take 1 capsule (25 mg total) by mouth 3 (three) times daily as needed.   21 capsule   0   . magic mouthwash w/lidocaine SOLN   Oral   Take 5 mLs by mouth 4 (four) times daily as needed for mouth pain.   240 mL   0     Please mix 60mL diphenhydramine, 60mL nystatin, 60 ...   . meloxicam (MOBIC) 7.5 MG tablet   Oral   Take 2 tablets (15 mg total) by mouth daily.   7 tablet   0   . naproxen (NAPROSYN) 500 MG tablet   Oral   Take 1 tablet (500 mg total) by mouth 2 (two) times daily with a meal.   60 tablet   0   . predniSONE (STERAPRED UNI-PAK 21 TAB) 10 MG (21) TBPK tablet      Take 6 tablets on day 1  Take 5 tablets on day 2 Take 4 tablets on day 3 Take 3 tablets on day 4 Take 2 tablets on day 5 Take 1 tablet on day 6   21 tablet   0     Allergies Review of patient's allergies indicates no known allergies.  No family history on file.  Social History Social History  Substance Use Topics  . Smoking status: Never Smoker   . Smokeless tobacco: None  . Alcohol Use: Yes     Review of Systems  Constitutional: No fever/chills ENT: No sore throat, Throat swelling, difficulty swallowing. Cardiovascular: No chest pain. Respiratory: No cough. No shortness of breath. No wheezing.  Musculoskeletal: Positive left foot pain. Skin: Positive for rash. No redness, swelling, skin sores. Neurological: Negative for headaches, focal weakness or numbness. No tingling   ____________________________________________   PHYSICAL EXAM:  VITAL SIGNS: ED Triage Vitals  Enc Vitals Group     BP 01/25/16 1242 156/70 mmHg     Pulse Rate 01/25/16 1242 66     Resp 01/25/16 1242 18     Temp 01/25/16 1242 98  F (36.7 C)     Temp Source 01/25/16 1242 Oral     SpO2 01/25/16 1242 99 %     Weight 01/25/16 1242 190 lb (86.183 kg)     Height 01/25/16 1242 5\' 4"  (1.626 m)     Head Cir --      Peak Flow --      Pain Score --      Pain Loc --      Pain Edu? --      Excl. in GC? --      Constitutional: Alert and oriented. Well appearing and in no acute distress. Eyes: Conjunctivae are normal.  Head: Atraumatic. Cardiovascular: Good peripheral circulation with 2+ pulses noted in the left lower extremity. Capillary refill is brisk in the digits of the left lower extremity Respiratory: Normal respiratory effort without tachypnea or retractions.  Musculoskeletal: Mild tenderness to palpation about the proximal, dorsal aspect of the left foot but without any bony abnormalities nor crepitus to palpation. Full range of motion of the left ankle, foot, toes without pain. No lower extremity tenderness nor edema.  No joint effusions. Neurologic:  Normal speech and language. No gross focal neurologic deficits are appreciated.  Skin:  Skin is warm, dry and intact. No rash noted about the chest. Psychiatric: Mood and affect are normal. Behavior is normal. Patient exhibits appropriate insight and judgement.   ____________________________________________   LABS  None ____________________________________________  EKG  None ____________________________________________  RADIOLOGY I have personally viewed and evaluated these images (plain radiographs) as part of my medical decision making, as well as reviewing the written report by the radiologist.  Dg Foot Complete Left  01/25/2016  CLINICAL DATA:  Chronic left foot pain worse in small and lawn yesterday. Lump on dorsal foot adjacent navicular. EXAM: LEFT FOOT - COMPLETE 3+ VIEW COMPARISON:  None. FINDINGS: Examination demonstrates mild degenerative changes over the midfoot, first MTP joint and interphalangeal joints. Small inferior calcaneal spur. No acute  fracture or dislocation. IMPRESSION: No acute findings. Electronically Signed   By: Elberta Fortisaniel  Boyle M.D.   On: 01/25/2016 14:15    ____________________________________________    PROCEDURES  Procedure(s) performed: None    Medications - No data to display   ____________________________________________   INITIAL IMPRESSION / ASSESSMENT AND PLAN / ED COURSE  Pertinent imaging results that were available during my care of the patient were reviewed  by me and considered in my medical decision making (see chart for details).  Patient's diagnosis is consistent with arthritis of the left foot and contact dermatitis due to plants that is resolving. Patient will be discharged home with prescriptions for Motrin and hydroxyzine to take as directed. Patient is to follow up with Dr. Orland Jarred in podiatry as needed for recurring foot pain. Patient may follow up with her primary care provider in regards to any recurrence of her rash. Patient is given ED precautions to return to the ED for any worsening or new symptoms.      ____________________________________________  FINAL CLINICAL IMPRESSION(S) / ED DIAGNOSES  Final diagnoses:  Arthritis of left foot  Contact dermatitis due to plant      NEW MEDICATIONS STARTED DURING THIS VISIT:  Discharge Medication List as of 01/25/2016  2:34 PM    START taking these medications   Details  hydrOXYzine (VISTARIL) 25 MG capsule Take 1 capsule (25 mg total) by mouth 3 (three) times daily as needed., Starting 01/25/2016, Until Discontinued, Print    meloxicam (MOBIC) 7.5 MG tablet Take 2 tablets (15 mg total) by mouth daily., Starting 01/25/2016, Until Discontinued, Print             Hope Pigeon, PA-C 01/25/16 1516  Myrna Blazer, MD 01/25/16 [phone]

## 2016-04-10 ENCOUNTER — Encounter: Payer: Self-pay | Admitting: Emergency Medicine

## 2016-04-10 ENCOUNTER — Emergency Department
Admission: EM | Admit: 2016-04-10 | Discharge: 2016-04-10 | Disposition: A | Discharge status: Home / Self Care | Payer: Medicare Other | Attending: Emergency Medicine | Admitting: Emergency Medicine

## 2016-04-10 DIAGNOSIS — L237 Allergic contact dermatitis due to plants, except food: Secondary | ICD-10-CM

## 2016-04-10 DIAGNOSIS — Z791 Long term (current) use of non-steroidal anti-inflammatories (NSAID): Secondary | ICD-10-CM | POA: Insufficient documentation

## 2016-04-10 DIAGNOSIS — L255 Unspecified contact dermatitis due to plants, except food: Secondary | ICD-10-CM | POA: Insufficient documentation

## 2016-04-10 MED ORDER — TRIAMCINOLONE ACETONIDE 0.1 % EX CREA: 1.0000 "application " | TOPICAL_CREAM | Freq: Four times a day (QID) | CUTANEOUS | 1 refills | Status: DC

## 2016-04-10 MED ORDER — TRIAMCINOLONE ACETONIDE 40 MG/ML IJ SUSP
40.0000 mg | Freq: Once | INTRAMUSCULAR | Status: AC
  Administered 2016-04-10: 40 mg via INTRAMUSCULAR
  Filled 2016-04-10: qty 1

## 2016-04-10 MED ORDER — PREDNISONE 20 MG PO TABS: 20.0000 mg | ORAL_TABLET | Freq: Every day | ORAL | 0 refills | Status: AC

## 2016-04-10 NOTE — ED Notes (Signed)
Pharmacy notified to send kenalog injection.

## 2016-04-10 NOTE — ED Triage Notes (Signed)
Pt in via triage with complaints of recurrent poison ivy.  Pt reports having to mow her sons lawn for a couple of months because he has moved recently.  Pt reports being seen previously and prescribed an oral medication which she does not recall the name of but has ran out of it.  Pt complains of itching, tingling.  Pt with poison ivy to bilateral feet and lower legs.  Pt A/Ox4, no immediate distress noted.  Pt is deaf, mobile interpreter at bedside.

## 2016-04-10 NOTE — ED Provider Notes (Signed)
[facility] Emergency Department Provider Note  ____________________________________________  Time seen: Approximately 6:31 PM  I have reviewed the triage vital signs and the nursing notes.   HISTORY  Chief Complaint Poison Ivy  Patient is deaf and ASL sign language interpreter is used  HPI Robin Stanley is a 65 y.o. female who presents to emergency department complaining of a rash to bilateral lower extremities. Patient states that she has known contacts with poison ivy and has had repeated episodes of contact dermatitis from same. Patient is requesting medication to clear up poison ivy.No other symptoms or complaints. She is tried calamine lotion with minimal relief. Symptoms are described as "itchy."   Past Medical History:  Diagnosis Date  . Anxiety     There are no active problems to display for this patient.   Past Surgical History:  Procedure Laterality Date  . CHOLECYSTECTOMY    . EYE SURGERY    . TUMOR REMOVAL      Prior to Admission medications   Medication Sig Start Date End Date Taking? Authorizing Provider  calamine lotion Apply 1 application topically as needed for itching. 05/11/15   Chinita Pester, FNP  cetirizine (ZYRTEC) 10 MG tablet Take 1 tablet (10 mg total) by mouth daily. 09/10/15   Delorise Royals Cuthriell, PA-C  fluticasone (FLONASE) 50 MCG/ACT nasal spray Place 2 sprays into both nostrils daily. 10/22/15   Jami L Hagler, PA-C  gentamicin (GARAMYCIN) 0.3 % ophthalmic solution Place 2 drops into the right eye every 4 (four) hours. 02/13/15   Jenise V Bacon Menshew, PA-C  hydrOXYzine (ATARAX/VISTARIL) 25 MG tablet Take 1 tablet (25 mg total) by mouth 3 (three) times daily as needed. 05/11/15   Chinita Pester, FNP  hydrOXYzine (VISTARIL) 25 MG capsule Take 1 capsule (25 mg total) by mouth 3 (three) times daily as needed. 01/25/16   Jami L Hagler, PA-C  magic mouthwash w/lidocaine SOLN Take 5 mLs by mouth 4 (four) times daily as  needed for mouth pain. 10/22/15   Jami L Hagler, PA-C  meloxicam (MOBIC) 7.5 MG tablet Take 2 tablets (15 mg total) by mouth daily. 01/25/16   Jami L Hagler, PA-C  naproxen (NAPROSYN) 500 MG tablet Take 1 tablet (500 mg total) by mouth 2 (two) times daily with a meal. 09/10/15   Delorise Royals Cuthriell, PA-C  predniSONE (DELTASONE) 20 MG tablet Take 1 tablet (20 mg total) by mouth daily. Take 3 tablets daily for 1 week, then take 2 tablets daily for 1 week, and take 1 tablet daily for one week 04/10/16 05/01/16  Christiane Ha D Cuthriell, PA-C  triamcinolone cream (KENALOG) 0.1 % Apply 1 application topically 4 (four) times daily. 04/10/16   Delorise Royals Cuthriell, PA-C    Allergies Review of patient's allergies indicates no known allergies.  No family history on file.  Social History Social History  Substance Use Topics  . Smoking status: Never Smoker  . Smokeless tobacco: Never Used  . Alcohol use Yes     Comment: occasional glass of wine     Review of Systems  Constitutional: No fever/chills Cardiovascular: no chest pain. Respiratory: no cough. No SOB. Gastrointestinal: No abdominal pain.  No nausea, no vomiting.  No diarrhea.  No constipation. Musculoskeletal: Negative for musculoskeletal pain. Skin: Positive for rash to bilateral lower extremity's. Neurological: Negative for headaches, focal weakness or numbness. 10-point ROS otherwise negative.  ____________________________________________   PHYSICAL EXAM:  VITAL SIGNS: ED Triage Vitals  Enc Vitals Group  BP      Pulse      Resp      Temp      Temp src      SpO2      Weight      Height      Head Circumference      Peak Flow      Pain Score      Pain Loc      Pain Edu?      Excl. in GC?      Constitutional: Alert and oriented. Well appearing and in no acute distress. Eyes: Conjunctivae are normal. PERRL. EOMI. Head: Atraumatic. ENT:      Mouth/Throat: Mucous membranes are moist. No oropharyngeal edema or  erythema. Neck: No stridor.    Cardiovascular: Normal rate, regular rhythm. Normal S1 and S2.  Good peripheral circulation. Respiratory: Normal respiratory effort without tachypnea or retractions. Lungs CTAB. Good air entry to the bases with no decreased or absent breath sounds. Musculoskeletal: Full range of motion to all extremities. No gross deformities appreciated. Neurologic:  Normal speech and language. No gross focal neurologic deficits are appreciated.  Skin:  Skin is warm, dry and intact. Erythematous macules and wheals noted to bilateral feet and lower extremities. Excoriations noted from scratching. Bilateral lower extremities are coated with calamine lotion Psychiatric: Mood and affect are normal. Speech and behavior are normal. Patient exhibits appropriate insight and judgement.   ____________________________________________   LABS (all labs ordered are listed, but only abnormal results are displayed)  Labs Reviewed - No data to display ____________________________________________  EKG   ____________________________________________  RADIOLOGY   No results found.  ____________________________________________    PROCEDURES  Procedure(s) performed:    Procedures    Medications  triamcinolone acetonide (KENALOG-40) injection 40 mg (not administered)     ____________________________________________   INITIAL IMPRESSION / ASSESSMENT AND PLAN / ED COURSE  Pertinent labs & imaging results that were available during my care of the patient were reviewed by me and considered in my medical decision making (see chart for details).  Clinical Course    Patient's diagnosis is consistent with Contact dermatitis from poison ivy. Patient has had multiple bouts with same. Due to this, patient will be given Kenalog injection and placed on prednisone taper. Patient also be given triamcinolone ointment for symptom relief..  Patient is to follow up with primary care  provider as needed or otherwise directed. Patient is given ED precautions to return to the ED for any worsening or new symptoms.     ____________________________________________  FINAL CLINICAL IMPRESSION(S) / ED DIAGNOSES  Final diagnoses:  Contact dermatitis due to poison ivy      NEW MEDICATIONS STARTED DURING THIS VISIT:  New Prescriptions   PREDNISONE (DELTASONE) 20 MG TABLET    Take 1 tablet (20 mg total) by mouth daily. Take 3 tablets daily for 1 week, then take 2 tablets daily for 1 week, and take 1 tablet daily for one week   TRIAMCINOLONE CREAM (KENALOG) 0.1 %    Apply 1 application topically 4 (four) times daily.        This chart was dictated using voice recognition software/Dragon. Despite best efforts to proofread, errors can occur which can change the meaning. Any change was purely unintentional.    Racheal Patches, PA-C 04/10/16 1840    Jeanmarie Plant, MD 04/10/16 2308

## 2016-04-10 NOTE — ED Notes (Signed)
Poison ivy to bilateral feet, pt is Deaf

## 2016-08-30 ENCOUNTER — Encounter: Payer: Self-pay | Admitting: Emergency Medicine

## 2016-08-30 ENCOUNTER — Emergency Department
Admission: EM | Admit: 2016-08-30 | Discharge: 2016-08-30 | Disposition: A | Discharge status: Home / Self Care | Payer: Federal, State, Local not specified - PPO | Attending: Student in an Organized Health Care Education/Training Program | Admitting: Student in an Organized Health Care Education/Training Program

## 2016-08-30 DIAGNOSIS — Z609 Problem related to social environment, unspecified: Secondary | ICD-10-CM | POA: Insufficient documentation

## 2016-08-30 DIAGNOSIS — Z789 Other specified health status: Secondary | ICD-10-CM

## 2016-08-30 DIAGNOSIS — Z79899 Other long term (current) drug therapy: Secondary | ICD-10-CM | POA: Diagnosis not present

## 2016-08-30 DIAGNOSIS — Z791 Long term (current) use of non-steroidal anti-inflammatories (NSAID): Secondary | ICD-10-CM | POA: Diagnosis not present

## 2016-08-30 LAB — INFLUENZA PANEL BY PCR (TYPE A & B)
Influenza A By PCR: NEGATIVE
Influenza B By PCR: NEGATIVE

## 2016-08-30 MED ORDER — LORAZEPAM 1 MG PO TABS
1.0000 mg | ORAL_TABLET | Freq: Once | ORAL | Status: DC
  Filled 2016-08-30: qty 1
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
## 2016-08-30 NOTE — ED Provider Notes (Signed)
[facility] Emergency Department Provider Note    First MD Initiated Contact with Patient 08/30/16 1639     (approximate)  I have reviewed the triage vital signs and the nursing notes.   HISTORY  Chief Complaint No chief complaint on file.    HPI Robin Stanley is a 65 y.o. female who presents to the ER with request to speak with social work due to concerns regarding her grandchildren under the care of her daughter-in-law. Patient states that she is selling her things and using garments since the money including food stamps to be exchanged for drugs. Patient reportedly tried calling the police and had called the police station left a message but did not get a call back. She then called 911. 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 She was then told by the police officer that she would be discharged for rest and should she called 911 again for "frivolous reasons ". Patient denies any SI or HI. No hallucinations. Denies any medical complaints.   Past Medical History:  Diagnosis Date  . Anxiety    No family history on file. Past Surgical History:  Procedure Laterality Date  . CHOLECYSTECTOMY    . EYE SURGERY    . TUMOR REMOVAL     There are no active problems to display for this patient.     Prior to Admission medications   Medication Sig Start Date End Date Taking? Authorizing Provider  calamine lotion Apply 1 application topically as needed for itching. 05/11/15   Chinita Pesterari B Triplett, FNP  cetirizine (ZYRTEC) 10 MG tablet Take 1 tablet (10 mg total) by mouth daily. 09/10/15   Delorise RoyalsJonathan D Cuthriell, PA-C  fluticasone (FLONASE) 50 MCG/ACT nasal spray Place 2 sprays into both nostrils daily. 10/22/15   Jami L Hagler, PA-C  gentamicin (GARAMYCIN) 0.3 % ophthalmic solution Place 2 drops into the right eye every 4 (four) hours. 02/13/15   Jenise V Bacon Menshew, PA-C  hydrOXYzine (ATARAX/VISTARIL) 25 MG tablet Take 1  tablet (25 mg total) by mouth 3 (three) times daily as needed. 05/11/15   Chinita Pesterari B Triplett, FNP  hydrOXYzine (VISTARIL) 25 MG capsule Take 1 capsule (25 mg total) by mouth 3 (three) times daily as needed. 01/25/16   Jami L Hagler, PA-C  magic mouthwash w/lidocaine SOLN Take 5 mLs by mouth 4 (four) times daily as needed for mouth pain. 10/22/15   Jami L Hagler, PA-C  meloxicam (MOBIC) 7.5 MG tablet Take 2 tablets (15 mg total) by mouth daily. 01/25/16   Jami L Hagler, PA-C  naproxen (NAPROSYN) 500 MG tablet Take 1 tablet (500 mg total) by mouth 2 (two) times daily with a meal. 09/10/15   Delorise RoyalsJonathan D Cuthriell, PA-C  triamcinolone cream (KENALOG) 0.1 % Apply 1 application topically 4 (four) times daily. 04/10/16   Delorise RoyalsJonathan D Cuthriell, PA-C    Allergies Patient has no known allergies.    Social History Social History  Substance Use Topics  . Smoking status: Never Smoker  . Smokeless tobacco: Never Used  . Alcohol use Yes     Comment: occasional glass of wine    Review of Systems Patient denies headaches, rhinorrhea, blurry vision, numbness, shortness of breath, chest pain, edema, cough, abdominal pain, nausea, vomiting, diarrhea, dysuria, fevers, rashes or hallucinations unless otherwise stated above in HPI. ____________________________________________   PHYSICAL EXAM:  VITAL SIGNS: Vitals:   08/30/16 1542  BP: (!) 178/99  Pulse: 71  Resp:  20  Temp: 97.7 F (36.5 C)    Constitutional: Alert and oriented. Well appearing and in no acute distress. Eyes: Conjunctivae are normal. PERRL. EOMI. Head: Atraumatic. Neurologic:  Normal speech and language. No gross focal neurologic deficits are appreciated. No gait instability. Skin:  Skin is warm, dry and intact. No rash noted. Psychiatric: Mood and affect are normal. Speech and behavior are normal.  ____________________________________________   LABS (all labs ordered are listed, but only abnormal results are displayed)  Results for  orders placed or performed during the hospital encounter of 08/30/16 (from the past 24 hour(s))  Influenza panel by PCR (type A & B, H1N1)     Status: None   Collection Time: 08/30/16  5:45 PM  Result Value Ref Range   Influenza A By PCR NEGATIVE NEGATIVE   Influenza B By PCR NEGATIVE NEGATIVE   ____________________________________________ ____________________________________________  RADIOLOGY   ____________________________________________   PROCEDURES  Procedure(s) performed: none Procedures    Critical Care performed: no ____________________________________________   INITIAL IMPRESSION / ASSESSMENT AND PLAN / ED COURSE  Pertinent labs & imaging results that were available during my care of the patient were reviewed by me and considered in my medical decision making (see chart for details).  DDX:   [name] is a 65 y.o. who presents to the ED with Above concerns and request to speak to social worker. I spoke with the patient via sign language interpreter. She does not have any medical concerns. Rapid strep and flu were ordered out of triage due to initial thought that she was complaining of sore throat. Patient denies these symptoms. Unfortunately we do not have Child psychotherapistsocial worker on call. She does not have any signs or symptoms of psychiatric illness. She appears well with good mentation. Have offered patient to speak with our Police Department here but she has declined this. I have encouraged patient to return tomorrow morning for social work consultation.  Have discussed with the patient and available family all diagnostics and treatments performed thus far and all questions were answered to the best of my ability. The patient demonstrates understanding and agreement with plan.   Clinical Course      ____________________________________________   FINAL CLINICAL IMPRESSION(S) / ED DIAGNOSES  Final diagnoses:  Social problem not due to mental disorder       NEW MEDICATIONS STARTED DURING THIS VISIT:  New Prescriptions   No medications on file     Note:  This document was prepared using Dragon voice recognition software and may include unintentional dictation errors.    Willy EddyPatrick Prescilla Monger, MD 08/30/16 [phone]

## 2016-08-30 NOTE — Discharge Instructions (Signed)
Please return to ER front desk tomorrow and ask to speak with the on-call social worker. You were seen in the ER last night and found not to have any evidence of an acute medical problem. Explain that you would like to speak with the social worker on-call tomorrow regarding concerns that we spoke about last night that are documented in your chart

## 2016-08-30 NOTE — ED Notes (Signed)
Signature pad not working.  Patient verbalized understanding of discharge instructions and has no further questions via the sign language interpreter.

## 2016-08-30 NOTE — ED Notes (Signed)
Interpreter via I-pad unavailable.  IT called and will work on problem.  Patient informed that t he in-person interpreter will arrive by 4:30 and we will continue triage at that point.  Most of triage notes were completed with the use of writing the answers.

## 2016-09-16 ENCOUNTER — Encounter: Payer: Self-pay | Admitting: Emergency Medicine

## 2016-09-16 DIAGNOSIS — Z791 Long term (current) use of non-steroidal anti-inflammatories (NSAID): Secondary | ICD-10-CM | POA: Insufficient documentation

## 2016-09-16 DIAGNOSIS — B029 Zoster without complications: Secondary | ICD-10-CM | POA: Diagnosis not present

## 2016-09-16 DIAGNOSIS — Z79899 Other long term (current) drug therapy: Secondary | ICD-10-CM | POA: Diagnosis not present

## 2016-09-16 DIAGNOSIS — M7601 Gluteal tendinitis, right hip: Secondary | ICD-10-CM | POA: Diagnosis present
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
## 2016-09-16 NOTE — ED Notes (Signed)
Following triage, use of tele-interpreter discussed with pt.  Pt states she does not feel she is fully understanding everything and wishes to wait for in-person interpreter or her daughter.  Pt states daughter won't be able to come until the morning.  Pt states she will wait in waiting room because she does not have a safe way home at this time.

## 2016-09-16 NOTE — ED Notes (Signed)
Attempted to complete triage using tele-interpreter, pt refused to use tele-interpreter, states she wants to wait for family or in-person interpreter

## 2016-09-16 NOTE — ED Triage Notes (Addendum)
Pt reports boil to buttock x two days, denies drainage.  Denies fever.    Triage completed using tele-interpreter

## 2016-09-17 ENCOUNTER — Emergency Department
Admission: EM | Admit: 2016-09-17 | Discharge: 2016-09-17 | Disposition: A | Discharge status: Home / Self Care | Payer: Federal, State, Local not specified - PPO | Attending: Emergency Medicine | Admitting: Emergency Medicine

## 2016-09-17 DIAGNOSIS — B029 Zoster without complications: Secondary | ICD-10-CM

## 2016-09-17 MED ORDER — VALACYCLOVIR HCL 1 G PO TABS: 1000.0000 mg | ORAL_TABLET | Freq: Three times a day (TID) | ORAL | 0 refills | Status: AC

## 2016-09-17 MED ORDER — VALACYCLOVIR HCL 500 MG PO TABS
ORAL_TABLET | ORAL | Status: AC
  Administered 2016-09-17: 500 mg
  Filled 2016-09-17: qty 2

## 2016-09-17 MED ORDER — VALACYCLOVIR HCL 1 G PO TABS: 1000.0000 mg | ORAL_TABLET | Freq: Two times a day (BID) | ORAL | 0 refills | Status: DC

## 2016-09-17 MED ORDER — CEPHALEXIN 500 MG PO CAPS
500.0000 mg | ORAL_CAPSULE | Freq: Once | ORAL | Status: AC
Start: 2016-09-17 — End: 2016-09-17
  Administered 2016-09-17: 500 mg via ORAL
  Filled 2016-09-17: qty 1

## 2016-09-17 MED ORDER — VALACYCLOVIR HCL 500 MG PO TABS
1000.0000 mg | ORAL_TABLET | Freq: Every day | ORAL | Status: DC
  Administered 2016-09-17: 1000 mg via ORAL

## 2016-09-17 NOTE — ED Notes (Signed)
Pt has red painful area to right buttock for 2 days  Pt is concerned she may have hiv because someone in house had n/v/d and she got it off of toilet seat.   Dr brown with pt.  Interpreter heidi on stick signing to pt.

## 2016-09-17 NOTE — ED Notes (Signed)
Report off to rebecca rn 

## 2016-09-17 NOTE — ED Provider Notes (Addendum)
[facility] Emergency Department Provider Note    First MD Initiated Contact with Patient 09/17/16 0235     (approximate)  I have reviewed the triage vital signs and the nursing notes.  History obtained via sign language troponin are HISTORY  Chief Complaint Abscess    HPI Robin Stanley is a 66 y.o. female presents with 2 day history of painful area right gluteal region. Patient denies any fever no trauma no insect bites. Patient states that the area was markedly painful   Past Medical History:  Diagnosis Date  . Anxiety     There are no active problems to display for this patient.   Past Surgical History:  Procedure Laterality Date  . CHOLECYSTECTOMY    . EYE SURGERY    . TUMOR REMOVAL      Prior to Admission medications   Medication Sig Start Date End Date Taking? Authorizing Provider  calamine lotion Apply 1 application topically as needed for itching. 05/11/15   Chinita Pesterari B Triplett, FNP  cetirizine (ZYRTEC) 10 MG tablet Take 1 tablet (10 mg total) by mouth daily. 09/10/15   Delorise RoyalsJonathan D Cuthriell, PA-C  fluticasone (FLONASE) 50 MCG/ACT nasal spray Place 2 sprays into both nostrils daily. 10/22/15   Jami L Hagler, PA-C  gentamicin (GARAMYCIN) 0.3 % ophthalmic solution Place 2 drops into the right eye every 4 (four) hours. 02/13/15   Jenise V Bacon Menshew, PA-C  hydrOXYzine (ATARAX/VISTARIL) 25 MG tablet Take 1 tablet (25 mg total) by mouth 3 (three) times daily as needed. 05/11/15   Chinita Pesterari B Triplett, FNP  hydrOXYzine (VISTARIL) 25 MG capsule Take 1 capsule (25 mg total) by mouth 3 (three) times daily as needed. 01/25/16   Jami L Hagler, PA-C  magic mouthwash w/lidocaine SOLN Take 5 mLs by mouth 4 (four) times daily as needed for mouth pain. 10/22/15   Jami L Hagler, PA-C  meloxicam (MOBIC) 7.5 MG tablet Take 2 tablets (15 mg total) by mouth daily. 01/25/16   Jami L Hagler, PA-C  naproxen (NAPROSYN) 500 MG tablet Take 1 tablet (500 mg total) by mouth  2 (two) times daily with a meal. 09/10/15   Delorise RoyalsJonathan D Cuthriell, PA-C  triamcinolone cream (KENALOG) 0.1 % Apply 1 application topically 4 (four) times daily. 04/10/16   Delorise RoyalsJonathan D Cuthriell, PA-C    Allergies Patient has no known allergies.  History reviewed. No pertinent family history.  Social History Social History  Substance Use Topics  . Smoking status: Never Smoker  . Smokeless tobacco: Never Used  . Alcohol use Yes     Comment: occasional glass of wine    Review of Systems Constitutional: No fever/chills Eyes: No visual changes. ENT: No sore throat. Cardiovascular: Denies chest pain. Respiratory: Denies shortness of breath. Gastrointestinal: No abdominal pain.  No nausea, no vomiting.  No diarrhea.  No constipation. Genitourinary: Negative for dysuria. Musculoskeletal: Negative for back pain. Skin: Positive for tender area right gluteal region Neurological: Negative for headaches, focal weakness or numbness.  10-point ROS otherwise negative.  ____________________________________________   PHYSICAL EXAM:  VITAL SIGNS: ED Triage Vitals [09/16/16 2306]  Enc Vitals Group     BP (!) 161/96     Pulse Rate 83     Resp 18     Temp 97.9 F (36.6 C)     Temp Source Oral     SpO2 97 %     Weight 180 lb (81.6 kg)     Height 5\' 4"  (1.626 m)  Head Circumference      Peak Flow      Pain Score 6     Pain Loc      Pain Edu?      Excl. in GC?     Constitutional: Alert and oriented. Well appearing and in no acute distress. Eyes: Conjunctivae are normal. PERRL. EOMI. Head: Atraumatic. Mouth/Throat: Mucous membranes are moist.  Oropharynx non-erythematous. Neck: No stridor.   Cardiovascular: Normal rate, regular rhythm. Good peripheral circulation. Grossly normal heart sounds. Respiratory: Normal respiratory effort.  No retractions. Lungs CTAB. Gastrointestinal: Soft and nontender. No distention.  Musculoskeletal: No lower extremity tenderness nor edema. No  gross deformities of extremities. Neurologic:  Normal speech and language. No gross focal neurologic deficits are appreciated.  Skin:  2 distinct areas of erythema with blistering centrallyRight gluteal region extending to median gluteal fold consistent with shingles Psychiatric: Mood and affect are normal. Speech and behavior are normal.   Procedures     INITIAL IMPRESSION / ASSESSMENT AND PLAN / ED COURSE  Pertinent labs & imaging results that were available during my care of the patient were reviewed by me and considered in my medical decision making (see chart for details).  History and physical exam consistent with shingles as such patient given Valtrex and will be prescribed same for home   Clinical Course     ____________________________________________  FINAL CLINICAL IMPRESSION(S) / ED DIAGNOSES  Final diagnoses:  Herpes zoster without complication     MEDICATIONS GIVEN DURING THIS VISIT:  Medications  valACYclovir (VALTREX) tablet 1,000 mg (not administered)  cephALEXin (KEFLEX) capsule 500 mg (not administered)     NEW OUTPATIENT MEDICATIONS STARTED DURING THIS VISIT:  New Prescriptions   No medications on file    Modified Medications   No medications on file    Discontinued Medications   No medications on file     Note:  This document was prepared using Dragon voice recognition software and may include unintentional dictation errors.    Darci Current, MD 09/17/16 9147    Darci Current, MD 09/17/16 [phone]
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
## 2016-09-17 NOTE — ED Notes (Signed)
[employer] contacted and they are not able to secure an interpreter at this time. They will keep trying to contact someone and will call me back if there is availability.

## 2017-02-15 ENCOUNTER — Encounter: Payer: Self-pay | Admitting: *Deleted

## 2017-02-15 ENCOUNTER — Emergency Department
Admission: EM | Admit: 2017-02-15 | Discharge: 2017-02-15 | Disposition: A | Discharge status: Home / Self Care | Payer: Federal, State, Local not specified - PPO | Attending: Emergency Medicine | Admitting: Emergency Medicine

## 2017-02-15 DIAGNOSIS — L237 Allergic contact dermatitis due to plants, except food: Secondary | ICD-10-CM | POA: Insufficient documentation

## 2017-02-15 DIAGNOSIS — R21 Rash and other nonspecific skin eruption: Secondary | ICD-10-CM | POA: Diagnosis present

## 2017-02-15 MED ORDER — DEXAMETHASONE SODIUM PHOSPHATE 10 MG/ML IJ SOLN
10.0000 mg | Freq: Once | INTRAMUSCULAR | Status: AC
  Administered 2017-02-15: 10 mg via INTRAMUSCULAR
  Filled 2017-02-15: qty 1

## 2017-02-15 MED ORDER — PREDNISONE 10 MG (21) PO TBPK: ORAL_TABLET | ORAL | 1 refills | Status: DC

## 2017-02-15 NOTE — ED Notes (Signed)
Pt was discharged with [employer] Interpreter Services

## 2017-02-15 NOTE — ED Provider Notes (Signed)
[facility] Emergency Department Provider Note  ____________________________________________  Time seen: Approximately 4:08 PM  I have reviewed the triage vital signs and the nursing notes.   HISTORY  Chief Complaint Rash    HPI Robin Stanley is a 66 y.o. female presenting to the emergency department with an erythematous and linear rash of the lower extremities, groin and face. Patient states that she has poison ivy plants in her backyard that she has to [facility] on a consistent basis. States that she is always trying to remove poison ivy. Patient states that she sought care with her primary care provider approximately 1 month ago and was prescribed hydroxyzine, which relieved the itching but rash subsequently returned. Patient denies chest tightness shortness of breath, dysphasia, nausea, vomiting and abdominal pain.   Past Medical History:  Diagnosis Date  . Anxiety     There are no active problems to display for this patient.   Past Surgical History:  Procedure Laterality Date  . CHOLECYSTECTOMY    . EYE SURGERY    . TUMOR REMOVAL      Prior to Admission medications   Medication Sig Start Date End Date Taking? Authorizing Provider  calamine lotion Apply 1 application topically as needed for itching. 05/11/15   Triplett, Cari B, FNP  cetirizine (ZYRTEC) 10 MG tablet Take 1 tablet (10 mg total) by mouth daily. 09/10/15   Cuthriell, Delorise Royals, PA-C  fluticasone (FLONASE) 50 MCG/ACT nasal spray Place 2 sprays into both nostrils daily. 10/22/15   Hagler, Jami L, PA-C  gentamicin (GARAMYCIN) 0.3 % ophthalmic solution Place 2 drops into the right eye every 4 (four) hours. 02/13/15   Menshew, Charlesetta Ivory, PA-C  hydrOXYzine (ATARAX/VISTARIL) 25 MG tablet Take 1 tablet (25 mg total) by mouth 3 (three) times daily as needed. 05/11/15   Triplett, Rulon Eisenmenger B, FNP  hydrOXYzine (VISTARIL) 25 MG capsule Take 1 capsule (25 mg total) by mouth 3 (three) times daily as  needed. 01/25/16   Hagler, Jami L, PA-C  magic mouthwash w/lidocaine SOLN Take 5 mLs by mouth 4 (four) times daily as needed for mouth pain. 10/22/15   Hagler, Jami L, PA-C  meloxicam (MOBIC) 7.5 MG tablet Take 2 tablets (15 mg total) by mouth daily. 01/25/16   Hagler, Jami L, PA-C  naproxen (NAPROSYN) 500 MG tablet Take 1 tablet (500 mg total) by mouth 2 (two) times daily with a meal. 09/10/15   Cuthriell, Delorise Royals, PA-C  predniSONE (STERAPRED UNI-PAK 21 TAB) 10 MG (21) TBPK tablet Take 6 tabs the the 1st day. Take 6 tabs the the 2nd day. Take 5 tabs the the 3rd day. Take 5 tabs the 4th day. Take 4 tabs the the 5th day.Take 4 tabs the the 6th day.Take 3 tabs the 7th day.Take 3 tabs the 8th day. Take 2 tabs the 9th day. Take 2 tabs the 10th day. Take 1 tab the 11th day. Take 1 tab the 12th day. 02/15/17   Orvil Feil, PA-C  triamcinolone cream (KENALOG) 0.1 % Apply 1 application topically 4 (four) times daily. 04/10/16   Cuthriell, Delorise Royals, PA-C    Allergies Patient has no known allergies.  History reviewed. No pertinent family history.  Social History Social History  Substance Use Topics  . Smoking status: Never Smoker  . Smokeless tobacco: Never Used  . Alcohol use Yes     Comment: occasional glass of wine     Review of Systems  Constitutional: No fever/chills Eyes: No visual  changes. No discharge ENT: No upper respiratory complaints. Cardiovascular: no chest pain. Respiratory: no cough. No SOB. Gastrointestinal: No abdominal pain.  No nausea, no vomiting.  No diarrhea.  No constipation. Musculoskeletal: Negative for musculoskeletal pain. Skin: Patient has rash.  Neurological: Negative for headaches, focal weakness or numbness.  ____________________________________________   PHYSICAL EXAM:  VITAL SIGNS: ED Triage Vitals [02/15/17 1502]  Enc Vitals Group     BP (!) 157/79     Pulse Rate 80     Resp 16     Temp 98.5 F (36.9 C)     Temp Source Oral     SpO2 97 %      Weight 203 lb (92.1 kg)     Height 5\' 4"  (1.626 m)     Head Circumference      Peak Flow      Pain Score      Pain Loc      Pain Edu?      Excl. in GC?      Constitutional: Alert and oriented. Well appearing and in no acute distress. Eyes: Conjunctivae are normal. PERRL. EOMI. Head: Atraumatic. Cardiovascular: Normal rate, regular rhythm. Normal S1 and S2.  Good peripheral circulation. Respiratory: Normal respiratory effort without tachypnea or retractions. Lungs CTAB. Good air entry to the bases with no decreased or absent breath sounds. Gastrointestinal: Bowel sounds 4 quadrants. Soft and nontender to palpation. No guarding or rigidity. No palpable masses. No distention. No CVA tenderness. Musculoskeletal: Full range of motion to all extremities. No gross deformities appreciated. Neurologic:  Normal speech and language. No gross focal neurologic deficits are appreciated.  Skin: Patient has an erythematous, diffuse linear rash of the lower extremities, groin and face. Psychiatric: Mood and affect are normal. Speech and behavior are normal. Patient exhibits appropriate insight and judgement.   ____________________________________________   LABS (all labs ordered are listed, but only abnormal results are displayed)  Labs Reviewed - No data to display ____________________________________________  EKG   ____________________________________________  RADIOLOGY   No results found.  ____________________________________________    PROCEDURES  Procedure(s) performed:    Procedures    Medications  dexamethasone (DECADRON) injection 10 mg (not administered)     ____________________________________________   INITIAL IMPRESSION / ASSESSMENT AND PLAN / ED COURSE  Pertinent labs & imaging results that were available during my care of the patient were reviewed by me and considered in my medical decision making (see chart for details).  Review of the [place] CSRS was  performed in accordance of the [facility] prior to dispensing any controlled drugs.     Assessment and plan: Poison ivy dermatitis Patient presents to the emergency department with an erythematous, linear rash after intermittent exposure to poison ivy approximately one month ago. Patient was given an injection of Decadron in the emergency department and discharged with a tapered prednisone.Vital signs are reassuring prior to discharge. All patient questions were answered.     ____________________________________________  FINAL CLINICAL IMPRESSION(S) / ED DIAGNOSES  Final diagnoses:  Poison ivy dermatitis      NEW MEDICATIONS STARTED DURING THIS VISIT:  New Prescriptions   PREDNISONE (STERAPRED UNI-PAK 21 TAB) 10 MG (21) TBPK TABLET    Take 6 tabs the the 1st day. Take 6 tabs the the 2nd day. Take 5 tabs the the 3rd day. Take 5 tabs the 4th day. Take 4 tabs the the 5th day.Take 4 tabs the the 6th day.Take 3 tabs the 7th day.Take 3 tabs the 8th day. Take 2  tabs the 9th day. Take 2 tabs the 10th day. Take 1 tab the 11th day. Take 1 tab the 12th day.        This chart was dictated using voice recognition software/Dragon. Despite best efforts to proofread, errors can occur which can change the meaning. Any change was purely unintentional.    Orvil FeilWoods, Larsen Dungan M, PA-C 02/15/17 1616    Emily FilbertWilliams, Jonathan E, MD 02/15/17 2007

## 2017-02-15 NOTE — ED Triage Notes (Signed)
States she got into some poison ivy a month ago and has been itching and burning since, no relief with cortisone cream, noticed on right hand, pt is deaf, interpreter used for ASL

## 2017-02-15 NOTE — ED Notes (Signed)
See triage note.. Was in contact with poison ivy about 1 month ago  conts to have rash with itching to legs,hand and face

## 2017-09-15 ENCOUNTER — Other Ambulatory Visit: Payer: Self-pay

## 2017-09-15 ENCOUNTER — Encounter: Payer: Self-pay | Admitting: Emergency Medicine

## 2017-09-15 DIAGNOSIS — F419 Anxiety disorder, unspecified: Secondary | ICD-10-CM | POA: Insufficient documentation

## 2017-09-15 DIAGNOSIS — Z79899 Other long term (current) drug therapy: Secondary | ICD-10-CM | POA: Diagnosis not present
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
## 2017-09-16 ENCOUNTER — Emergency Department
Admission: EM | Admit: 2017-09-16 | Discharge: 2017-09-16 | Disposition: A | Discharge status: Home / Self Care | Payer: Medicare Other | Attending: Emergency Medicine | Admitting: Emergency Medicine

## 2017-09-16 DIAGNOSIS — F419 Anxiety disorder, unspecified: Secondary | ICD-10-CM

## 2017-09-16 MED ORDER — LORAZEPAM 1 MG PO TABS: 1.0000 mg | ORAL_TABLET | Freq: Three times a day (TID) | ORAL | 0 refills | Status: AC | PRN

## 2017-09-16 MED ORDER — LORAZEPAM 1 MG PO TABS
1.0000 mg | ORAL_TABLET | Freq: Once | ORAL | Status: AC
  Administered 2017-09-16: 1 mg via ORAL
  Filled 2017-09-16: qty 1

## 2017-09-16 NOTE — Discharge Instructions (Signed)
Please take your anxiety medication as needed for severe symptoms and follow-up with the psychiatrist tomorrow for recheck.  Return to the emergency department sooner for any concerns whatsoever.  It was a pleasure to take care of you today, and thank you for coming to our emergency department.  If you have any questions or concerns before leaving please ask the nurse to grab me and I'm more than happy to go through your aftercare instructions again.  If you were prescribed any opioid pain medication today such as Norco, Vicodin, Percocet, morphine, hydrocodone, or oxycodone please make sure you do not drive when you are taking this medication as it can alter your ability to drive safely.  If you have any concerns once you are home that you are not improving or are in fact getting worse before you can make it to your follow-up appointment, please do not hesitate to call 911 and come back for further evaluation.  Merrily BrittleNeil Eleena Grater, MD

## 2017-09-16 NOTE — ED Provider Notes (Signed)
[facility] Emergency Department Provider Note  ____________________________________________   First MD Initiated Contact with Patient 09/16/17 0022     (approximate)  I have reviewed the triage vital signs and the nursing notes.   HISTORY  Chief Complaint Anxiety   HPI Robin Stanley is a 67 y.o. female who self presents the emergency department with anxiety.  She has a long-standing history of anxiety and formerly took Xanax but has run out.  She has seen psychiatry in the past although not in quite some time and has been out of Xanax.  She is concerned now because an ex-boyfriend is recently gotten out of jail and she is worried about him.  She does not feel comfortable going home, however she can live at her brother's house which she says is safe.  She denies suicidality or homicidality.  She contracts for safety.  Her symptoms were gradual onset are now severe they are intermittent.  They are worsened by interpersonal conflict and improved with benzodiazepines.  Past Medical History:  Diagnosis Date  . Anxiety     There are no active problems to display for this patient.   Past Surgical History:  Procedure Laterality Date  . CHOLECYSTECTOMY    . EYE SURGERY    . TUMOR REMOVAL      Prior to Admission medications   Medication Sig Start Date End Date Taking? Authorizing Provider  calamine lotion Apply 1 application topically as needed for itching. 05/11/15   Triplett, Cari B, FNP  cetirizine (ZYRTEC) 10 MG tablet Take 1 tablet (10 mg total) by mouth daily. 09/10/15   Cuthriell, Delorise RoyalsJonathan D, PA-C  fluticasone (FLONASE) 50 MCG/ACT nasal spray Place 2 sprays into both nostrils daily. 10/22/15   Hagler, Jami L, PA-C  gentamicin (GARAMYCIN) 0.3 % ophthalmic solution Place 2 drops into the right eye every 4 (four) hours. 02/13/15   Menshew, Charlesetta IvoryJenise V Bacon, PA-C  hydrOXYzine (ATARAX/VISTARIL) 25 MG tablet Take 1 tablet (25 mg total) by mouth 3 (three)  times daily as needed. 05/11/15   Triplett, Rulon Eisenmengerari B, FNP  hydrOXYzine (VISTARIL) 25 MG capsule Take 1 capsule (25 mg total) by mouth 3 (three) times daily as needed. 01/25/16   Hagler, Jami L, PA-C  LORazepam (ATIVAN) 1 MG tablet Take 1 tablet (1 mg total) by mouth every 8 (eight) hours as needed for up to 5 doses for anxiety. 09/16/17 09/21/17  Merrily Brittleifenbark, Kenlyn Lose, MD  magic mouthwash w/lidocaine SOLN Take 5 mLs by mouth 4 (four) times daily as needed for mouth pain. 10/22/15   Hagler, Jami L, PA-C  meloxicam (MOBIC) 7.5 MG tablet Take 2 tablets (15 mg total) by mouth daily. 01/25/16   Hagler, Jami L, PA-C  naproxen (NAPROSYN) 500 MG tablet Take 1 tablet (500 mg total) by mouth 2 (two) times daily with a meal. 09/10/15   Cuthriell, Delorise RoyalsJonathan D, PA-C  predniSONE (STERAPRED UNI-PAK 21 TAB) 10 MG (21) TBPK tablet Take 6 tabs the the 1st day. Take 6 tabs the the 2nd day. Take 5 tabs the the 3rd day. Take 5 tabs the 4th day. Take 4 tabs the the 5th day.Take 4 tabs the the 6th day.Take 3 tabs the 7th day.Take 3 tabs the 8th day. Take 2 tabs the 9th day. Take 2 tabs the 10th day. Take 1 tab the 11th day. Take 1 tab the 12th day. 02/15/17   Orvil FeilWoods, Jaclyn M, PA-C  triamcinolone cream (KENALOG) 0.1 % Apply 1 application topically 4 (four) times daily.  04/10/16   Cuthriell, Delorise Royals, PA-C    Allergies Patient has no known allergies.  No family history on file.  Social History Social History   Tobacco Use  . Smoking status: Never Smoker  . Smokeless tobacco: Never Used  Substance Use Topics  . Alcohol use: Yes    Comment: occasional glass of wine  . Drug use: No    Review of Systems Constitutional: No fever/chills Eyes: No visual changes. Cardiovascular: Denies chest pain. Respiratory: Denies shortness of breath. Gastrointestinal: No abdominal pain.  No nausea, no vomiting.  Genitourinary: Negative for dysuria. Skin: Negative for rash. Neurological: Negative for headaches, focal weakness or  numbness.   ____________________________________________   PHYSICAL EXAM:  VITAL SIGNS: ED Triage Vitals [09/15/17 2042]  Enc Vitals Group     BP (!) 164/80     Pulse Rate 72     Resp 18     Temp 98 F (36.7 C)     Temp Source Oral     SpO2 98 %     Weight      Height      Head Circumference      Peak Flow      Pain Score      Pain Loc      Pain Edu?      Excl. in GC?     Constitutional: Alert and oriented x4 pleasant cooperative no distress Eyes: PERRL EOMI. Head: Atraumatic. Nose: No congestion/rhinnorhea. Mouth/Throat: No trismus Neck: No stridor.   Cardiovascular: Normal rate, regular rhythm. Grossly normal heart sounds.  Good peripheral circulation. Respiratory: Normal respiratory effort.  No retractions. Lungs CTAB and moving good air Musculoskeletal: No lower extremity edema   Neurologic:  Nonverbal  No gross focal neurologic deficits are appreciated. Skin:  Skin is warm, dry and intact. No rash noted. Psychiatric: Somewhat anxious appearing    ____________________________________________   DIFFERENTIAL includes but not limited to  Panic attack, generalized anxiety disorder, thyrotoxicosis ____________________________________________   LABS (all labs ordered are listed, but only abnormal results are displayed)  Labs Reviewed - No data to display   __________________________________________  EKG   ____________________________________________  RADIOLOGY   ____________________________________________   PROCEDURES  Procedure(s) performed: no  Procedures  Critical Care performed: no  Observation: no ____________________________________________   INITIAL IMPRESSION / ASSESSMENT AND PLAN / ED COURSE  Pertinent labs & imaging results that were available during my care of the patient were reviewed by me and considered in my medical decision making (see chart for details).  Had a lengthy discussion with the patient using a pen and  paper as the patient is nonverbal.  Given a single dose of lorazepam here she is not driving which improved her symptoms.  I will prescribe her a short course as an outpatient and refer her to psychiatry.  Feels safe going to live with her brother.  She is discharged home in improved condition understands and agrees with the plan.      ____________________________________________   FINAL CLINICAL IMPRESSION(S) / ED DIAGNOSES  Final diagnoses:  Anxiety      NEW MEDICATIONS STARTED DURING THIS VISIT:  This SmartLink is deprecated. Use AVSMEDLIST instead to display the medication list for a patient.   Note:  This document was prepared using Dragon voice recognition software and may include unintentional dictation errors.     Merrily Brittle, MD 09/16/17 [phone]

## 2018-08-29 ENCOUNTER — Ambulatory Visit: Payer: Self-pay | Admitting: Family Medicine

## 2019-06-27 ENCOUNTER — Encounter: Payer: Self-pay | Admitting: Intensive Care

## 2019-06-27 ENCOUNTER — Emergency Department
Admission: EM | Admit: 2019-06-27 | Discharge: 2019-06-27 | Disposition: A | Discharge status: Home / Self Care | Payer: Federal, State, Local not specified - PPO | Attending: Emergency Medicine | Admitting: Emergency Medicine

## 2019-06-27 ENCOUNTER — Other Ambulatory Visit: Payer: Self-pay

## 2019-06-27 DIAGNOSIS — Z79899 Other long term (current) drug therapy: Secondary | ICD-10-CM | POA: Diagnosis not present

## 2019-06-27 DIAGNOSIS — W19XXXD Unspecified fall, subsequent encounter: Secondary | ICD-10-CM | POA: Diagnosis not present

## 2019-06-27 DIAGNOSIS — M79641 Pain in right hand: Secondary | ICD-10-CM | POA: Insufficient documentation

## 2019-06-27 DIAGNOSIS — M25511 Pain in right shoulder: Secondary | ICD-10-CM | POA: Diagnosis not present

## 2019-06-27 DIAGNOSIS — W19XXXA Unspecified fall, initial encounter: Secondary | ICD-10-CM

## 2019-06-27 DIAGNOSIS — M25562 Pain in left knee: Secondary | ICD-10-CM | POA: Diagnosis not present

## 2019-06-27 DIAGNOSIS — H913 Deaf nonspeaking, not elsewhere classified: Secondary | ICD-10-CM | POA: Insufficient documentation

## 2019-06-27 HISTORY — DX: Unspecified hearing loss, unspecified ear: H91.90

## 2019-06-27 NOTE — ED Provider Notes (Signed)
[facility] Emergency Department Provider Note  ____________________________________________  Time seen: Approximately 3:51 PM  I have reviewed the triage vital signs and the nursing notes.   HISTORY  Chief Complaint Arm Pain (right)    HPI Robin Stanley is a 68 y.o. female presents to the emergency department with acute and aching right shoulder pain, left knee pain and right hand pain.  Patient states that she was in [facility] approximately 2 weeks ago and fell due to an uneven floor.  She denies loss of consciousness or neck pain.  No numbness or tingling in the upper or lower extremities.  She denies chest pain, chest tightness or abdominal pain.  Patient states that she does not want x-rays taken but would like a shoulder immobilizer and a knee brace so that she can mow her grass daily.  No other alleviating measures have been attempted.        Past Medical History:  Diagnosis Date  . Anxiety   . Deaf     There are no active problems to display for this patient.   Past Surgical History:  Procedure Laterality Date  . CHOLECYSTECTOMY    . EYE SURGERY    . TUMOR REMOVAL      Prior to Admission medications   Medication Sig Start Date End Date Taking? Authorizing Provider  calamine lotion Apply 1 application topically as needed for itching. 05/11/15   Triplett, Cari B, FNP  cetirizine (ZYRTEC) 10 MG tablet Take 1 tablet (10 mg total) by mouth daily. 09/10/15   Cuthriell, Charline Bills, PA-C  fluticasone (FLONASE) 50 MCG/ACT nasal spray Place 2 sprays into both nostrils daily. 10/22/15   Hagler, Jami L, PA-C    Allergies Patient has no known allergies.  History reviewed. No pertinent family history.  Social History Social History   Tobacco Use  . Smoking status: Never Smoker  . Smokeless tobacco: Never Used  Substance Use Topics  . Alcohol use: Yes    Comment: occasional glass of wine  . Drug use: No     Review of  Systems  Constitutional: No fever/chills Eyes: No visual changes. No discharge ENT: No upper respiratory complaints. Cardiovascular: no chest pain. Respiratory: no cough. No SOB. Gastrointestinal: No abdominal pain.  No nausea, no vomiting.  No diarrhea.  No constipation. Musculoskeletal: Patient has right shoulder pain, left knee pain and right hand pain. Skin: Negative for rash, abrasions, lacerations, ecchymosis. Neurological: Negative for headaches, focal weakness or numbness.  ____________________________________________   PHYSICAL EXAM:  VITAL SIGNS: ED Triage Vitals  Enc Vitals Group     BP 06/27/19 1448 (!) 150/89     Pulse Rate 06/27/19 1448 71     Resp 06/27/19 1448 14     Temp 06/27/19 1448 98.9 F (37.2 C)     Temp Source 06/27/19 1448 Oral     SpO2 06/27/19 1448 96 %     Weight 06/27/19 1454 180 lb (81.6 kg)     Height 06/27/19 1454 5\' 4"  (9.924 m)     Head Circumference --      Peak Flow --      Pain Score 06/27/19 1453 7     Pain Loc --      Pain Edu? --      Excl. in [place]? --      Constitutional: Alert and oriented. Well appearing and in no acute distress. Eyes: Conjunctivae are normal. PERRL. EOMI. Head: Atraumatic. ENT  Mouth/Throat: Mucous membranes are moist.  Neck: No stridor.  Full range of motion.  No midline C-spine tenderness to palpation. Hematological/Lymphatic/Immunilogical: No cervical lymphadenopathy. Cardiovascular: Normal rate, regular rhythm. Normal S1 and S2.  Good peripheral circulation. Respiratory: Normal respiratory effort without tachypnea or retractions. Lungs CTAB. Good air entry to the bases with no decreased or absent breath sounds. Gastrointestinal: Bowel sounds 4 quadrants. Soft and nontender to palpation. No guarding or rigidity. No palpable masses. No distention. No CVA tenderness. Musculoskeletal: Full range of motion to all extremities. No gross deformities appreciated. Neurologic:  Normal speech and language. No  gross focal neurologic deficits are appreciated.  Skin:  Skin is warm, dry and intact. No rash noted. Psychiatric: Mood and affect are normal. Speech and behavior are normal. Patient exhibits appropriate insight and judgement.   ____________________________________________   LABS (all labs ordered are listed, but only abnormal results are displayed)  Labs Reviewed - No data to display ____________________________________________  EKG   ____________________________________________  RADIOLOGY    No results found.  ____________________________________________    PROCEDURES  Procedure(s) performed:    Procedures    Medications - No data to display   ____________________________________________   INITIAL IMPRESSION / ASSESSMENT AND PLAN / ED COURSE  Pertinent labs & imaging results that were available during my care of the patient were reviewed by me and considered in my medical decision making (see chart for details).  Review of the [place] CSRS was performed in accordance of the [facility] prior to dispensing any controlled drugs.           Assessment and plan Fall 68 year old female presents to the emergency department after mechanical, non-syncopal fall that occurred 2 weeks ago.  Patient was hypertensive in triage but vital signs were otherwise reassuring.  I used an American sign Presenter, broadcastinglanguage translator during history and physical exam.  Differential diagnosis includes fracture, contusion, ligamentous injury...  Patient declined x-rays of right shoulder, right hand and left knee in the emergency department stating that she was very poor and did not want to pay bill on imaging.  I offered to place patient in a shoulder immobilizer per her request as she did not want a sling.  Patient wanted a knee brace to be given during this emergency department encounter also.  I informed her that we do not have knee braces but that we do have knee immobilizers.  Conveyed to patient  that knee immobilizers would not be conducive to ambulation as she would not be able to bend her knee.  Patient stated that she was in the emergency department 3 years ago and was given a brace and was very confused and angry as to why she could not be given another brace today.  I apologized for a lack of knee braces here in the emergency department and told her that [facility] desired [facility], it would be given to her.  Patient stated that if she could not be given both knee brace and shoulder immobilizer, she wanted to be discharged. All patient questions were answered.    ____________________________________________  FINAL CLINICAL IMPRESSION(S) / ED DIAGNOSES  Final diagnoses:  Fall, initial encounter      NEW MEDICATIONS STARTED DURING THIS VISIT:  ED Discharge Orders    None          This chart was dictated using voice recognition software/Dragon. Despite best efforts to proofread, errors can occur which can change the meaning. Any change was purely unintentional.    Joseph ArtWoods,  Dayna Barker, PA-C 06/27/19 1558    Sharman Cheek, MD 07/02/19 [phone]

## 2019-06-27 NOTE — ED Triage Notes (Signed)
Patient reports tripping last Friday on sandals and falling to ground. C/o left knee, right shoulder, and right hand middle pain. Denies LOC. Denies hitting head. Patient ambulatory into ER with no problems

## 2019-06-27 NOTE — ED Notes (Signed)
See triage note  States she tripped and fell about 2 weeks ago  Having pain to left shoulder and knee  Ambulates well

## 2020-11-21 ENCOUNTER — Encounter: Payer: Self-pay | Admitting: Emergency Medicine

## 2020-11-21 ENCOUNTER — Emergency Department
Admission: EM | Admit: 2020-11-21 | Discharge: 2020-11-22 | Disposition: A | Discharge status: Home / Self Care | Payer: Federal, State, Local not specified - PPO | Attending: Emergency Medicine | Admitting: Emergency Medicine

## 2020-11-21 ENCOUNTER — Other Ambulatory Visit: Payer: Self-pay

## 2020-11-21 DIAGNOSIS — R259 Unspecified abnormal involuntary movements: Secondary | ICD-10-CM | POA: Diagnosis present

## 2020-11-21 DIAGNOSIS — F600 Paranoid personality disorder: Secondary | ICD-10-CM | POA: Insufficient documentation

## 2020-11-21 DIAGNOSIS — F319 Bipolar disorder, unspecified: Secondary | ICD-10-CM | POA: Diagnosis not present

## 2020-11-21 DIAGNOSIS — R4689 Other symptoms and signs involving appearance and behavior: Secondary | ICD-10-CM

## 2020-11-21 DIAGNOSIS — F4322 Adjustment disorder with anxiety: Secondary | ICD-10-CM | POA: Diagnosis not present

## 2020-11-21 LAB — CBC
HCT: 47.9 % — ABNORMAL HIGH (ref 36.0–46.0)
Hemoglobin: 15.3 g/dL — ABNORMAL HIGH (ref 12.0–15.0)
MCH: 26.5 pg (ref 26.0–34.0)
MCHC: 31.9 g/dL (ref 30.0–36.0)
MCV: 83 fL (ref 80.0–100.0)
Platelets: 269 10*3/uL (ref 150–400)
RBC: 5.77 MIL/uL — ABNORMAL HIGH (ref 3.87–5.11)
RDW: 14.4 % (ref 11.5–15.5)
WBC: 7.3 10*3/uL (ref 4.0–10.5)
nRBC: 0 % (ref 0.0–0.2)

## 2020-11-21 LAB — URINE DRUG SCREEN, QUALITATIVE (ARMC ONLY)
Amphetamines, Ur Screen: NOT DETECTED
Barbiturates, Ur Screen: NOT DETECTED
Benzodiazepine, Ur Scrn: NOT DETECTED
Cannabinoid 50 Ng, Ur ~~LOC~~: NOT DETECTED
Cocaine Metabolite,Ur ~~LOC~~: NOT DETECTED
MDMA (Ecstasy)Ur Screen: NOT DETECTED
Methadone Scn, Ur: NOT DETECTED
Opiate, Ur Screen: NOT DETECTED
Phencyclidine (PCP) Ur S: NOT DETECTED
Tricyclic, Ur Screen: NOT DETECTED

## 2020-11-21 LAB — SALICYLATE LEVEL: Salicylate Lvl: <7 mg/dL — ABNORMAL LOW (ref 7.0–30.0)

## 2020-11-21 LAB — COMPREHENSIVE METABOLIC PANEL
ALT: 46 U/L — ABNORMAL HIGH (ref 0–44)
AST: 35 U/L (ref 15–41)
Albumin: 4.2 g/dL (ref 3.5–5.0)
Alkaline Phosphatase: 80 U/L (ref 38–126)
Anion gap: 9 (ref 5–15)
BUN: 18 mg/dL (ref 8–23)
CO2: 25 mmol/L (ref 22–32)
Calcium: 9.1 mg/dL (ref 8.9–10.3)
Chloride: 105 mmol/L (ref 98–111)
Creatinine, Ser: 1.03 mg/dL — ABNORMAL HIGH (ref 0.44–1.00)
GFR, Estimated: 58 mL/min — ABNORMAL LOW (ref >60)
Glucose, Bld: 115 mg/dL — ABNORMAL HIGH (ref 70–99)
Potassium: 3.1 mmol/L — ABNORMAL LOW (ref 3.5–5.1)
Sodium: 139 mmol/L (ref 135–145)
Total Bilirubin: 0.8 mg/dL (ref 0.3–1.2)
Total Protein: 7.7 g/dL (ref 6.5–8.1)

## 2020-11-21 LAB — ACETAMINOPHEN LEVEL: Acetaminophen (Tylenol), Serum: <10 ug/mL — ABNORMAL LOW (ref 10–30)

## 2020-11-21 LAB — ETHANOL: Alcohol, Ethyl (B): <10 mg/dL (ref <10)

## 2020-11-21 NOTE — ED Notes (Signed)
Pt eating dinner tray °

## 2020-11-21 NOTE — ED Triage Notes (Signed)
Pt comes into the ED via [facility] under IVC.  Papers drawn up by the patient's daughter.  Per [facility] paperwork, patient has been diagnosed with bipolar disorder and has not been taking her bipolar medication. Pt's family is concerned she is living out of her car, and at her storage unit.  When [facility] picked her up they did verify that it looked as though she was living in her car and they did pick her up at the storage unit.  Per the paperwork the patient also has not been paying her bills and drinking on a nightly basis.  PT denies that any of this information is true.  Pt denies any SI or HI.

## 2020-11-21 NOTE — ED Provider Notes (Signed)
[facility] Emergency Department Provider Note ____________________________________________   Event Date/Time   First MD Initiated Contact with Patient 11/21/20 1905     (approximate)  I have reviewed the triage vital signs and the nursing notes.   HISTORY  Chief Complaint Psychiatric Evaluation  Patient is deaf; history of present illness and review of systems were obtained via video ASL interpreter  HPI Robin Stanley is a 70 y.o. female with PMH as noted below who presents under involuntary commitment with concern for paranoid and erratic behavior.  Per the IVC paperwork, the patient has a history of bipolar disorder and is noncompliant with medications.  She has been living out of her car and demonstrating paranoid thoughts.  There is concerned that the patient is unable to care for herself.  The patient states that she feels fine.  She denies any acute mental health or physical complaints.  She states that her daughter abuses drugs and he is not trustworthy.  She states that has not seen her daughter for many years and the daughter recently reappeared and was trying to stay with the patient.  History reviewed. No pertinent past medical history.  There are no problems to display for this patient.   Past Surgical History:  Procedure Laterality Date  . CHOLECYSTECTOMY    . EYE SURGERY      Prior to Admission medications   Not on File    Allergies Patient has no known allergies.  History reviewed. No pertinent family history.  Social History Social History   Tobacco Use  . Smoking status: Never Smoker  . Smokeless tobacco: Never Used  Substance Use Topics  . Alcohol use: Yes    Comment: occasional- wine    Review of Systems  Constitutional: No fever. Eyes: No visual changes. ENT: No sore throat. Cardiovascular: Denies chest pain. Respiratory: Denies shortness of breath. Gastrointestinal: No vomiting or diarrhea.  Genitourinary:  Negative for flank pain. Musculoskeletal: Negative for back pain. Skin: Negative for rash. Neurological: Negative for headache.   ____________________________________________   PHYSICAL EXAM:  VITAL SIGNS: ED Triage Vitals  Enc Vitals Group     BP 11/21/20 1746 (!) 196/99     Pulse Rate 11/21/20 1746 74     Resp 11/21/20 1746 18     Temp 11/21/20 1746 98.2 F (36.8 C)     Temp Source 11/21/20 1746 Oral     SpO2 11/21/20 1746 97 %     Weight 11/21/20 1747 180 lb (81.6 kg)     Height 11/21/20 1747 5\' 4"  (1.626 m)     Head Circumference --      Peak Flow --      Pain Score 11/21/20 1747 0     Pain Loc --      Pain Edu? --      Excl. in GC? --     Constitutional: Alert and oriented. Well appearing and in no acute distress. Eyes: Conjunctivae are normal.  Head: Atraumatic. Nose: No congestion/rhinnorhea. Mouth/Throat: Mucous membranes are moist.   Neck: Normal range of motion.  Cardiovascular: Normal rate, regular rhythm.  Good peripheral circulation. Respiratory: Normal respiratory effort.  No retractions.  Gastrointestinal: No distention.  Musculoskeletal: Extremities warm and well perfused.  Neurologic:  Normal speech and language. No gross focal neurologic deficits are appreciated.  Skin:  Skin is warm and dry. No rash noted. Psychiatric: Mood and affect are normal. Speech and behavior are normal.  ____________________________________________   LABS (all labs ordered are  listed, but only abnormal results are displayed)  Labs Reviewed  COMPREHENSIVE METABOLIC PANEL - Abnormal; Notable for the following components:      Result Value   Potassium 3.1 (*)    Glucose, Bld 115 (*)    Creatinine, Ser 1.03 (*)    ALT 46 (*)    GFR, Estimated 58 (*)    All other components within normal limits  SALICYLATE LEVEL - Abnormal; Notable for the following components:   Salicylate Lvl <7.0 (*)    All other components within normal limits  ACETAMINOPHEN LEVEL - Abnormal;  Notable for the following components:   Acetaminophen (Tylenol), Serum <10 (*)    All other components within normal limits  CBC - Abnormal; Notable for the following components:   RBC 5.77 (*)    Hemoglobin 15.3 (*)    HCT 47.9 (*)    All other components within normal limits  ETHANOL  URINE DRUG SCREEN, QUALITATIVE ([facility] ONLY)   ____________________________________________  EKG   ____________________________________________  RADIOLOGY    ____________________________________________   PROCEDURES  Procedure(s) performed: No  Procedures  Critical Care performed: No ____________________________________________   INITIAL IMPRESSION / ASSESSMENT AND PLAN / ED COURSE  Pertinent labs & imaging results that were available during my care of the patient were reviewed by me and considered in my medical decision making (see chart for details).  70 year old female with PMH as noted above presents under involuntary commitment with concern for medication noncompliance for her bipolar disorder and paranoid thoughts and erratic behavior.  Per the paperwork, patient is currently living out of her car and is unable to care for herself.  The patient denies any acute complaints and states that she has no history of bipolar disorder.  History was obtained from the patient via video ASL interpreter.  I reviewed the past medical records in Epic.  The patient has a few prior visits and some under a different chart but does not appear to have any documented history of bipolar disorder and is not on any psychiatric medications.  I do not see any prior inpatient psychiatric admissions.  During my evaluation the patient was calm, cooperative, and appropriate.  She is not demonstrating any delusional behavior or disorganized thought at this time.  Overall it circumstances of the situation are unclear.  If the documented history and the IVC paperwork is accurate, there is a significant possibility that  the patient is acutely unable to care for herself, so I will keep the patient under involuntary commitment until we can have psychiatry see her and get more collateral information.  Psychiatry consult is pending.  ----------------------------------------- 11:15 PM on 11/21/2020 -----------------------------------------  Per psychiatry NP, the patient has been evaluated.  They will need more time to talk to the daughter and obtain additional collateral information.  Plan will be likely psychiatry reassessment in the morning.  I will sign the patient out to the oncoming ED physician.  ____________________________  The patient has been placed in psychiatric observation due to the need to provide a safe environment for the patient while obtaining psychiatric consultation and evaluation, as well as ongoing medical and medication management to treat the patient's condition.  The patient has been placed under full IVC at this time.  ____________________________________________   FINAL CLINICAL IMPRESSION(S) / ED DIAGNOSES  Final diagnoses:  Behavior concern      NEW MEDICATIONS STARTED DURING THIS VISIT:  New Prescriptions   No medications on file     Note:  This document  was prepared using Conservation officer, historic buildings and may include unintentional dictation errors.   Dionne Bucy, MD 11/21/20 2316

## 2020-11-21 NOTE — ED Notes (Signed)
Pt laying in bed and sleeping. Unable to assess current SI/HI. Pt laying in bed, NAD noted.

## 2020-11-21 NOTE — ED Notes (Signed)
md spoke with interpreter and pt on a stick.  Pt calm and cooperative.   Pt in hallway bed

## 2020-11-21 NOTE — ED Notes (Signed)
Personal Belongings:  Black shoes White socks [employer] Blue pants Black purse [employer] Red underwear Tan bra 2 grey metal rings (Pt preferred to place rings in her purse instead of specimen cup)

## 2020-11-22 DIAGNOSIS — F319 Bipolar disorder, unspecified: Secondary | ICD-10-CM | POA: Diagnosis present

## 2020-11-22 DIAGNOSIS — F4322 Adjustment disorder with anxiety: Secondary | ICD-10-CM

## 2020-11-22 MED ORDER — DIPHENHYDRAMINE HCL 25 MG PO CAPS
25.0000 mg | ORAL_CAPSULE | Freq: Once | ORAL | Status: AC
  Administered 2020-11-22: 25 mg via ORAL

## 2020-11-22 MED ORDER — DIPHENHYDRAMINE HCL 25 MG PO CAPS
50.0000 mg | ORAL_CAPSULE | Freq: Once | ORAL | Status: DC
  Filled 2020-11-22: qty 2
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
## 2020-11-22 NOTE — ED Notes (Signed)
VOLUNTARY as IVC was rescinded by Dr Clapacs to d/c 

## 2020-11-22 NOTE — Consult Note (Signed)
[facility] Face-to-Face Psychiatry Consult   [facility]: Psychiatric Evaluation Referring Physician: Dr. Marisa Severin Patient Identification: Robin Stanley MRN:  [id number] Principal Diagnosis: <principal problem not specified> Diagnosis:  Active Problems:   Bipolar I disorder (HCC)   Total Time spent with patient: 1 hour  Subjective:  Use of ASL translation " I did not want to come here tonight. This is not my idea." Robin Stanley is a 70 y.o. female patient presented to [facility] ED via law enforcement under involuntary commitment status (IVC). Per the IVC paper work the patient was exhibiting erratic behavior. The patient was assessed using ASL tralastion. Per the ED triage nurse note, The pt comes into the ED via [employer] under IVC.  Papers drawn up by the patient's daughter.  Per [employer] paperwork, patient has been diagnosed with bipolar disorder and has not been taking her bipolar medication. Pt's family is concerned she is living out of her car, and at her storage unit.  When [employer] picked her up they did verify that it looked as though she was living in her car and they did pick her up at the storage unit.  Per the paperwork the patient also has not been paying her bills and drinking on a nightly basis.  PT denies that any of this information is true.  Pt denies any SI or HI.   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 During her assessment, the psychiatry team enlisted the help of ASL translation to communicate with the patient.   The patient disclosed that she does not take any medications, which her brother agreed to, and voiced that the patient has been prescribed medications but refused to take them. The patient was diagnosed with bipolar many years ago.   The patient was seen face-to-face by this provider; the chart was reviewed and consulted with Dr.Siadecki on 11/21/2020 due to the patient's care. It was discussed with the EDP that the patient remained under observation overnight  and will be reassessed in the a.m. to determine if she meets the criteria for psychiatric inpatient admission; she could be discharged back home. On evaluation, the patient is alert and oriented x 4, anxious but cooperative, and mood-congruent with affect.  The patient does not appear to be responding to internal or external stimuli. Neither is the patient presenting with any delusional thinking. The patient denies auditory or visual hallucinations. The patient denies any suicidal, homicidal, or self-harm ideations. The patient is not presenting with any psychotic or paranoid behaviors. During an encounter with the patient, she answered questions appropriately. Ms. Robin Stanley obtained collateral from the patient's Brother Robin Stanley [phone]). 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 Pt expressed concerns about pt's ability to manage her money. Brother reported that he suspects pt may be giving money to people on facebook, but is not entirely certain. Brother reported that he does not believe that the pt is a danger to herself, however he does have concerns about the pt's tendency to sleep in her car while parked at [employer]/sheets. Brother explained that the pt's daughter and her partner recently moved in with the pt to try to help her; however, their relationship has been volatile. Brother described the pt's relationship with her son as strained as well. Brother reported that the pt has PMH hx and has been hospitalized in [place] and [place] hospital. Brother reported that the pt does not comply with her medications.  HPI:  Per Dr. Marisa Severin, Robin Stanley is a 70 y.o. female with PMH as noted below who presents under involuntary commitment with concern for paranoid and erratic behavior.  Per the IVC paperwork, the patient has a history of bipolar disorder and is noncompliant with medications.  She has been living out of her car and  demonstrating paranoid thoughts.  There is concerned that the patient is unable to care for herself.  The patient states that she feels fine.  She denies any acute mental health or physical complaints.  She states that her daughter abuses drugs and he is not trustworthy.  She states that has not seen her daughter for many years and the daughter recently reappeared and was trying to stay with the patient.  Past Psychiatric History:   Risk to Self:   No Risk to Others:  No Prior Inpatient Therapy:  Yes Prior Outpatient Therapy:  Yes  Past Medical History: History reviewed. No pertinent past medical history.  Past Surgical History:  Procedure Laterality Date   CHOLECYSTECTOMY     EYE SURGERY     Family History: History reviewed. No pertinent family history. Family Psychiatric  History:  Social History:  Social History   Substance and Sexual Activity  Alcohol Use Yes   Comment: occasional- wine     Social History   Substance and Sexual Activity  Drug Use Not on file    Social History   Socioeconomic History   Marital status: Unknown    Spouse name: Not on file   Number of children: Not on file   Years of education: Not on file   Highest education level: Not on file  Occupational History   Not on file  Tobacco Use   Smoking status: Never Smoker   Smokeless tobacco: Never Used  Substance and Sexual Activity   Alcohol use: Yes    Comment: occasional- wine   Drug use: Not on file   Sexual activity: Not on file  Other Topics Concern   Not on file  Social History Narrative   Not on file   Social Determinants of Health   Financial Resource Strain: Not on file  Food Insecurity: Not on file  Transportation Needs: Not on file  Physical Activity: Not on file  Stress: Not on file  Social Connections: Not on file   Additional Social History:    Allergies:  No Known Allergies  Labs:  Results for orders placed or performed during the hospital encounter  of 11/21/20 (from the past 48 hour(s))  Comprehensive metabolic panel     Status: Abnormal   Collection Time: 11/21/20  6:10 PM  Result Value Ref Range   Sodium 139 135 - 145 mmol/L   Potassium 3.1 (L) 3.5 - 5.1 mmol/L   Chloride 105 98 - 111 mmol/L   CO2 25 22 - 32 mmol/L   Glucose, Bld 115 (H) 70 - 99 mg/dL    Comment: Glucose reference range applies only to samples taken after fasting for at least 8 hours.   BUN 18 8 - 23 mg/dL   Creatinine, Ser 5.64 (H) 0.44 - 1.00 mg/dL   Calcium 9.1 8.9 - 33.2 mg/dL   Total Protein 7.7 6.5 - 8.1 g/dL   Albumin 4.2 3.5 - 5.0 g/dL   AST 35 15 - 41 U/L   ALT 46 (H) 0 - 44 U/L   Alkaline Phosphatase 80 38 - 126 U/L   Total Bilirubin 0.8 0.3 - 1.2 mg/dL   GFR,  Estimated 58 (L) >60 mL/min    Comment: (NOTE) Calculated using the CKD-EPI Creatinine Equation (2021)    Anion gap 9 5 - 15    Comment: Performed at [facility], [address] [place], [place] [address]  Ethanol     Status: None   Collection Time: 11/21/20  6:10 PM  Result Value Ref Range   Alcohol, Ethyl (B) <10 <10 mg/dL    Comment: (NOTE) Lowest detectable limit for serum alcohol is 10 mg/dL.  For medical purposes only. Performed at [facility], [address] [place], [place] [address]   Salicylate level     Status: Abnormal   Collection Time: 11/21/20  6:10 PM  Result Value Ref Range   Salicylate Lvl <7.0 (L) 7.0 - 30.0 mg/dL    Comment: Performed at [facility], [address] [place], [place] [address]  Acetaminophen level     Status: Abnormal   Collection Time: 11/21/20  6:10 PM  Result Value Ref Range   Acetaminophen (Tylenol), Serum <10 (L) 10 - 30 ug/mL    Comment: (NOTE) Therapeutic concentrations vary significantly. A range of 10-30 ug/mL  may be an effective concentration for many patients. However, some  are best treated at concentrations outside of this range. Acetaminophen concentrations >150 ug/mL at 4 hours after  ingestion  and >50 ug/mL at 12 hours after ingestion are often associated with  toxic reactions.  Performed at [facility], [address] [place], [place] [address]   cbc     Status: Abnormal   Collection Time: 11/21/20  6:10 PM  Result Value Ref Range   WBC 7.3 4.0 - 10.5 K/uL   RBC 5.77 (H) 3.87 - 5.11 MIL/uL   Hemoglobin 15.3 (H) 12.0 - 15.0 g/dL   HCT 29.547.9 (H) 62.136.0 - 30.846.0 %   MCV 83.0 80.0 - 100.0 fL   MCH 26.5 26.0 - 34.0 pg   MCHC 31.9 30.0 - 36.0 g/dL   RDW 65.714.4 84.611.5 - 96.215.5 %   Platelets 269 150 - 400 K/uL   nRBC 0.0 0.0 - 0.2 %    Comment: Performed at [facility], [address] [place], [place] [address]  Urine Drug Screen, Qualitative     Status: None   Collection Time: 11/21/20  6:10 PM  Result Value Ref Range   Tricyclic, Ur Screen NONE DETECTED NONE DETECTED   Amphetamines, Ur Screen NONE DETECTED NONE DETECTED   MDMA (Ecstasy)Ur Screen NONE DETECTED NONE DETECTED   Cocaine Metabolite,Ur [place] NONE DETECTED NONE DETECTED   Opiate, Ur Screen NONE DETECTED NONE DETECTED   Phencyclidine (PCP) Ur S NONE DETECTED NONE DETECTED   Cannabinoid 50 Ng, Ur [place] NONE DETECTED NONE DETECTED   Barbiturates, Ur Screen NONE DETECTED NONE DETECTED   Benzodiazepine, Ur Scrn NONE DETECTED NONE DETECTED   Methadone Scn, Ur NONE DETECTED NONE DETECTED    Comment: (NOTE) Tricyclics + metabolites, urine    Cutoff 1000 ng/mL Amphetamines + metabolites, urine  Cutoff 1000 ng/mL MDMA (Ecstasy), urine              Cutoff 500 ng/mL Cocaine Metabolite, urine          Cutoff 300 ng/mL Opiate + metabolites, urine        Cutoff 300 ng/mL Phencyclidine (PCP), urine         Cutoff 25 ng/mL Cannabinoid, urine                 Cutoff  50 ng/mL Barbiturates + metabolites, urine  Cutoff 200 ng/mL Benzodiazepine, urine              Cutoff 200 ng/mL Methadone, urine                   Cutoff 300 ng/mL  The urine drug screen provides only a preliminary, unconfirmed analytical  test result and should not be used for non-medical purposes. Clinical consideration and professional judgment should be applied to any positive drug screen result due to possible interfering substances. A more specific alternate chemical method must be used in order to obtain a confirmed analytical result. Gas chromatography / mass spectrometry (GC/MS) is the preferred confirm atory method. Performed at [facility], [address] [place], [place] [address]     No current facility-administered medications for this encounter.   No current outpatient medications on file.    Musculoskeletal: Strength & Muscle Tone: within normal limits Gait & Station: normal Patient leans: N/A  Psychiatric Specialty Exam:  Presentation  General Appearance: Appropriate for Environment  Eye Contact:Good  Speech:Other (comment) (Mute/Deft and using sign language)  Speech Volume:Other (comment) (Deaf)  Handedness:Right   Mood and Affect  Mood:Irritable; Depressed  Affect:Depressed; Congruent   Thought Process  Thought Processes:Coherent  Descriptions of Associations:Intact  Orientation:Full (Time, Place and Person)  Thought Content:Abstract Reasoning; Logical; Paranoid Ideation; Tangential  History of Schizophrenia/Schizoaffective disorder:No  Duration of Psychotic Symptoms:No data recorded Hallucinations:Hallucinations: None  Ideas of Reference:None  Suicidal Thoughts:Suicidal Thoughts: No  Homicidal Thoughts:Homicidal Thoughts: No   Sensorium  Memory:Immediate Good; Recent Good; Remote Good  Judgment:Good  Insight:Fair   Executive Functions  Concentration:Fair  Attention Span:Fair  Recall:Fair  Fund of Knowledge:Fair  Language:Other (comment) (Uses interprator)   Psychomotor Activity  Psychomotor Activity:Psychomotor Activity: Normal   Assets  Assets:Communication Skills; Leisure Time; Health and safety inspector; Social  Support   Sleep  Sleep:Sleep: Good Number of Hours of Sleep: 6   Physical Exam: Physical Exam Vitals and nursing note reviewed.  Constitutional:      Appearance: Normal appearance.  HENT:     Right Ear: External ear normal.     Left Ear: External ear normal.     Nose: Nose normal.  Cardiovascular:     Rate and Rhythm: Normal rate.     Pulses: Normal pulses.  Pulmonary:     Effort: Pulmonary effort is normal.  Musculoskeletal:        General: Normal range of motion.     Cervical back: Normal range of motion and neck supple.  Neurological:     Mental Status: She is alert. Mental status is at baseline.  Psychiatric:        Attention and Perception: Attention and perception normal.        Mood and Affect: Mood is anxious and depressed. Affect is flat and angry.        Speech: She is noncommunicative.        Behavior: Behavior normal. Behavior is cooperative.        Thought Content: Thought content normal.        Cognition and Memory: Cognition and memory normal.        Judgment: Judgment normal.    Review of Systems  Psychiatric/Behavioral: Positive for depression. The patient is nervous/anxious.    Blood pressure (!) 210/89, pulse 82, temperature 98.2 F (36.8 C), temperature source Oral, resp. rate 18, height 5\' 4"  (1.626 m), weight 81.6 kg, SpO2 98 %. Body mass index is 30.9 kg/m.  Treatment Plan Summary: Daily contact with patient to assess and evaluate symptoms and progress in treatment. The patient remained under observation overnight and will be reassessed in the a.m. to determine if she meets the criteria for psychiatric inpatient admission; she could be discharged back home.  Disposition: Supportive therapy provided about ongoing stressors. The patient remained under observation overnight and will be reassessed in the a.m. to determine if she meets the criteria for psychiatric inpatient admission; she could be discharged back home.  Gillermo Murdoch,  NP 11/22/2020 2:16 AM

## 2020-11-22 NOTE — ED Provider Notes (Signed)
Emergency Medicine Observation Re-evaluation Note  Robin Stanley is a 70 y.o. female, seen on rounds today.  Pt initially presented to the ED for complaints of Psychiatric Evaluation Currently, the patient is resting comfortably.  Physical Exam  BP (!) 210/89 (BP Location: Right Arm)   Pulse 82   Temp 98.2 F (36.8 C) (Oral)   Resp 18   Ht 5\' 4"  (1.626 m)   Wt 81.6 kg   SpO2 98%   BMI 30.90 kg/m  Physical Exam Gen: No acute distress  Resp: Normal rise and fall of chest Neuro: Moving all four extremities Psych: Resting currently, calm and cooperative when awake   ED Course / MDM  EKG:    I have reviewed the labs performed to date as well as medications administered while in observation.  Recent changes in the last 24 hours include no acute events overnight.  Plan  Current plan is for reassessment by psychiatry in the morning. Patient is under full IVC at this time.   Minh Roanhorse, , DO 11/22/20 [phone]

## 2020-11-22 NOTE — ED Notes (Signed)
This Clinical research associate contacted her cousin Trey Paula who can come pick her up after he runs some errands.

## 2020-11-22 NOTE — ED Notes (Signed)
Pt states she has itching to the right arm. Redness noted. Pt states she is going to get hives due to worrying. Pt would like benadryl to help. ER provider notified. ER provider placed order. Communication was on paper from pt

## 2020-11-22 NOTE — ED Notes (Addendum)
Pt is using pen and paper to communicate. Pt is requesting her cell phone so she can communicate with her significant other and that Dr. Marisa Severin said she could. RN talked with Dr. Marisa Severin and he stated he informed pt that she would not be able to use her phone, but that we can call anyone for her and give them updates. RN informed patient that this RN is willing to call whomever and give them an update. Pt states she does not know her significant others phone number and that she does not want me to call her brother or daughter.  RN did take pen back after communication was complete.

## 2020-11-22 NOTE — ED Notes (Signed)
Pt asking to use her phone to be able to communicate with manager at the storage center about her car so that it does not get towed. This RN spoke with charge nurse Aundra Millet, who is agreeable to pt being able to use her cell phone for communication. Pt is aware that she will not be able to keep her cell phone and will have to return it to staff.

## 2020-11-22 NOTE — BH Assessment (Addendum)
Collateral contact Brother Clinical biochemist Bachelor [phone]). 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 Pt expressed concerns about pt's ability to manage her money. Brother reported that he suspects pt may be giving money to people on facebook, but is not entirely certain. Brother reported that he does not believe that the pt is a danger to herself, however he does have concerns about the pt's tendency to sleep in her car while parked at [employer]/sheets. Brother explained that the pt's daughter and her partner recently moved in with the pt to try to help her; however, their relationship has been volatile. Brother described the pt's relationship with her son as strained as well. Brother reported that the pt has PMH hx and has been hospitalized in [place] and [place] hospital. Brother reported that the pt does not comply with her medications.

## 2020-11-22 NOTE — ED Notes (Signed)
Dr. Elesa Massed notified that pt would not let this RN get a repeat blood pressure.

## 2020-11-22 NOTE — ED Notes (Signed)
Phone given to patient per Dr. Delaney Meigs request for discharge ride home.

## 2020-11-22 NOTE — BH Assessment (Signed)
Comprehensive Clinical Assessment (CCA) Note  11/22/2020 Robin Stanley [id number]  Recommendations for Services/Supports/Treatments: Per Robin Pih T., NP, pt. is recommended for overnight observation and reassessment in the morning.   Flowsheet Row ED from 11/21/2020 in [facility]  C-SSRS RISK CATEGORY No Risk     Chief Complaint:  Chief Complaint  Patient presents with  . Psychiatric Evaluation  The patient demonstrates the following risk factors for suicide: Chronic risk factors for suicide include: N/A. Acute risk factors for suicide include: family or marital conflict. Protective factors for this patient include: life satisfaction. Considering these factors, the overall suicide risk at this point appears to be low. Patient is appropriate for outpatient follow up. Recommendations for Services/Supports/Treatments: Per Robin Pih T., NP, pt. is recommended for overnight observation and reassessment in the morning.   Robin Stanley is a 70 year old patient who presented to [facility] ED under IVC due exhibiting erratic behavior. Pt was assessed using ASL translation due to being deaf. Pt presents with speech and thought processes that were logical and intact. When asked what brought her to the hospital the pt. stated, "I wasn't the one who wanted to come. It's my daughter" Pt presented with a dysphoric mood and an anxious affect. The patient was cooperative when answering questions. Pt denied any mental health symptomology and any accounts made on her IVC. Pt identified her stressors as the discord between her and her daughter. Pt presents with poor insight. Pt currently denied suicidal ideations, HI and AV/H. Pt reported that her daughter and her daughter's partner moved in and are attempting to take over her home. Pt asserted that her daughter smokes weed and is therefore not credible; pt was preoccupied with convincing this Clinical research associate to align with her beliefs about her  daughter. Pt emphasized that she is content in her life, has friends, and a boyfriend. Pt mentioned having facebook friends.  Pt denied using any substances. Pt reported having paranoia about eating the meals prepared by her daughter. Pt reports feeling betrayed and shocked by her daughter's decision to have her IVC'd.  Visit Diagnosis: Deferred   CCA Screening, Triage and Referral (STR)  Patient Reported Information How did you hear about [place]? Family/Friend  Referral name: Robin Stanley  Referral phone number: [phone]   Whom do you see for routine medical problems? I don't have a doctor  Practice/Facility Name: No data recorded Practice/Facility Phone Number: No data recorded Name of Contact: No data recorded Contact Number: No data recorded Contact Fax Number: No data recorded Prescriber Name: No data recorded Prescriber Address (if known): No data recorded  What Is the Reason for Your Visit/Call Today? IVC  How Long Has This Been Causing You Problems? 1-6 months  What Do You Feel Would Help You the Most Today? Other (Comment) (Pt unable to identify any services needed.)   Have You Recently Been in Any Inpatient Treatment (Hospital/Detox/Crisis Center/28-Day Program)? No  Name/Location of Program/Hospital:No data recorded How Long Were You There? No data recorded When Were You Discharged? No data recorded  Have You Ever Received Services From [facility] Before? No  Who Do You See at [facility]? No data recorded  Have You Recently Had Any Thoughts About Hurting Yourself? No  Are You Planning to Commit Suicide/Harm Yourself At This time? No   Have you Recently Had Thoughts About Hurting Someone Robin Stanley? No  Explanation: No data recorded  Have You Used Any Alcohol or Drugs in the Past 24 Hours? No  How Long  Ago Did You Use Drugs or Alcohol? No data recorded What Did You Use and How Much? No data recorded  Do You Currently Have a Therapist/Psychiatrist? No  Name  of Therapist/Psychiatrist: No data recorded  Have You Been Recently Discharged From Any Office Practice or Programs? No  Explanation of Discharge From Practice/Program: No data recorded    CCA Screening Triage Referral Assessment Type of Contact: Face-to-Face  Is this Initial or Reassessment? No data recorded Date Telepsych consult ordered in [facility]:  No data recorded Time Telepsych consult ordered in [facility]:  No data recorded  Patient Reported Information Reviewed? Yes  Patient Left Without Being Seen? No data recorded Reason for Not Completing Assessment: No data recorded  Collateral Involvement: Robin Stanley [phone]   Does Patient Have a Court Appointed Legal Guardian? No data recorded Name and Contact of Legal Guardian: No data recorded If Minor and Not Living with Parent(s), Who has Custody? No data recorded Is CPS involved or ever been involved? Never  Is APS involved or ever been involved? Never   Patient Determined To Be At Risk for Harm To Self or Others Based on Review of Patient Reported Information or Presenting Complaint? No  Method: No data recorded Availability of Means: No data recorded Intent: No data recorded Notification Required: No data recorded Additional Information for Danger to Others Potential: No data recorded Additional Comments for Danger to Others Potential: No data recorded Are There Guns or Other Weapons in Your Home? No data recorded Types of Guns/Weapons: No data recorded Are These Weapons Safely Secured?                            No data recorded Who Could Verify You Are Able To Have These Secured: No data recorded Do You Have any Outstanding Charges, Pending Court Dates, Parole/Probation? No data recorded Contacted To Inform of Risk of Harm To Self or Others: No data recorded  Location of Assessment: [facility] ED   Does Patient Present under Involuntary Commitment? Yes  IVC Papers Initial File Date: No data recorded  [place]  of Residence[place]   Patient Currently Receiving the Following Services: Not Receiving Services   Determination of Need: Emergent (2 hours)   Options For Referral: Other: Comment (Pt is recommended for overnight observation and reassessment in the AM.)     CCA Biopsychosocial Intake/Chief Complaint:  IVC  Current Symptoms/Problems: Conflict with her daughter   Patient Reported Schizophrenia/Schizoaffective Diagnosis in Past: No   Strengths: Pt is expansive  Preferences: None reported  Abilities: Communicative   Type of Services Patient Feels are Needed: None reported.   Initial Clinical Notes/Concerns: No data recorded  Mental Health Symptoms Depression:  None   Duration of Depressive symptoms: No data recorded  Mania:  None   Anxiety:   None   Psychosis:  None   Duration of Psychotic symptoms: No data recorded  Trauma:  None   Obsessions:  None   Compulsions:  None   Inattention:  None   Hyperactivity/Impulsivity:  N/A   Oppositional/Defiant Behaviors:  Resentful   Emotional Irregularity:  None   Other Mood/Personality Symptoms:  No data recorded   Mental Status Exam Appearance and self-care  Stature:  Average   Weight:  Overweight   Clothing:  Casual   Grooming:  Normal   Cosmetic use:  None   Posture/gait:  Normal   Motor activity:  Not Remarkable   Sensorium  Attention:  Normal   Concentration:  Normal   Orientation:  Time; Place; Person; Object   Recall/memory:  Normal   Affect and Mood  Affect:  Labile   Mood:  Anxious   Relating  Eye contact:  Normal   Facial expression:  Responsive   Attitude toward examiner:  Cooperative   Thought and Language  Speech flow: Other (Comment) (Pt used an Counselling psychologist)   Thought content:  Appropriate to Mood and Circumstances   Preoccupation:  Ruminations   Hallucinations:  None   Organization:  No data recorded  [employer] of Knowledge:  Good    Intelligence:  Average   Abstraction:  Normal   Judgement:  Impaired   Reality Testing:  Distorted   Insight:  Denial   Decision Making:  Impulsive   Social Functioning  Social Maturity:  Irresponsible   Social Judgement:  Naive   Stress  Stressors:  Family conflict   Coping Ability:  Human resources officer Deficits:  Interpersonal   Supports:  No data recorded    Religion: Religion/Spirituality Are You A Religious Person?: Yes What is Your Religious Affiliation?: Chiropodist: Leisure / Recreation Do You Have Hobbies?: No  Exercise/Diet: Exercise/Diet Do You Exercise?: No Have You Gained or Lost A Significant Amount of Weight in the Past Six Months?:  (Pt unable to identify) Do You Follow a Special Diet?: No Do You Have Any Trouble Sleeping?: No   CCA Employment/Education Employment/Work Situation: Employment / Work Psychologist, occupational Employment situation: Unemployed Has patient ever been in the [employer]?: No  Education:     CCA Family/Childhood History Family and Relationship History: Family history Marital status: Single What is your sexual orientation?: Heterosexual Does patient have children?: Yes How many children?: 2 How is patient's relationship with their children?: Strained  Childhood History:     Child/Adolescent Assessment:     CCA Substance Use Alcohol/Drug Use: Alcohol / Drug Use History of alcohol / drug use?: No history of alcohol / drug abuse                         ASAM's:  Six Dimensions of Multidimensional Assessment  Dimension 1:  Acute Intoxication and/or Withdrawal Potential:      Dimension 2:  Biomedical Conditions and Complications:      Dimension 3:  Emotional, Behavioral, or Cognitive Conditions and Complications:     Dimension 4:  Readiness to Change:     Dimension 5:  Relapse, Continued use, or Continued Problem Potential:     Dimension 6:  Recovery/Living Environment:     ASAM  Severity Score:    ASAM Recommended Level of Treatment:      Makyah Lavigne R Liel Rudden, LCAS

## 2020-11-22 NOTE — ED Notes (Signed)
Pt noted to be turning in bed. RN went up to pt and with a pen and paper wrote "Can I get a new blood pressure." Pt stated "no"

## 2020-11-22 NOTE — ED Notes (Signed)
Pt resting with blanket over head. NAD noted at this time. Equal and unlabored breathing noted.

## 2020-11-22 NOTE — ED Provider Notes (Signed)
Cleared for d/c by behavioral team   Jene Every, MD 11/22/20 1148

## 2020-11-22 NOTE — ED Notes (Addendum)
Patient is in consult room with [facility] and Dr. Delaney Meigs.

## 2021-06-19 ENCOUNTER — Emergency Department
Admission: EM | Admit: 2021-06-19 | Discharge: 2021-06-19 | Disposition: A | Discharge status: Home / Self Care | Payer: Federal, State, Local not specified - PPO | Attending: Emergency Medicine | Admitting: Emergency Medicine

## 2021-06-19 ENCOUNTER — Other Ambulatory Visit: Payer: Self-pay

## 2021-06-19 DIAGNOSIS — X509XXA Other and unspecified overexertion or strenuous movements or postures, initial encounter: Secondary | ICD-10-CM | POA: Diagnosis not present

## 2021-06-19 DIAGNOSIS — S34109A Unspecified injury to unspecified level of lumbar spinal cord, initial encounter: Secondary | ICD-10-CM | POA: Diagnosis present

## 2021-06-19 DIAGNOSIS — S39012A Strain of muscle, fascia and tendon of lower back, initial encounter: Secondary | ICD-10-CM | POA: Diagnosis not present

## 2021-06-19 DIAGNOSIS — M545 Low back pain, unspecified: Secondary | ICD-10-CM

## 2021-06-19 MED ORDER — HYDROCODONE-ACETAMINOPHEN 5-325 MG PO TABS: 1.0000 | ORAL_TABLET | Freq: Four times a day (QID) | ORAL | 0 refills | Status: AC | PRN

## 2021-06-19 MED ORDER — METHOCARBAMOL 500 MG PO TABS: 500.0000 mg | ORAL_TABLET | Freq: Three times a day (TID) | ORAL | 0 refills | Status: AC | PRN

## 2021-06-19 MED ORDER — MELOXICAM 7.5 MG PO TABS: 7.5000 mg | ORAL_TABLET | Freq: Every day | ORAL | 0 refills | Status: AC

## 2021-06-19 MED ORDER — KETOROLAC TROMETHAMINE 60 MG/2ML IM SOLN
30.0000 mg | Freq: Once | INTRAMUSCULAR | Status: AC
  Administered 2021-06-19: 30 mg via INTRAMUSCULAR
  Filled 2021-06-19: qty 2

## 2021-06-19 MED ORDER — METHOCARBAMOL 500 MG PO TABS
500.0000 mg | ORAL_TABLET | Freq: Once | ORAL | Status: AC
  Administered 2021-06-19: 500 mg via ORAL
  Filled 2021-06-19: qty 1

## 2021-06-19 NOTE — ED Provider Notes (Signed)
[facility] Emergency Department Provider Note   ____________________________________________   Event Date/Time   First MD Initiated Contact with Patient 06/19/21 [phone]     (approximate)  I have reviewed the triage vital signs and the nursing notes.   HISTORY  Chief Complaint Back Pain  History obtained via sign language interpreter  HPI Robin Stanley is a 70 y.o. female who presents to the ED from home with a chief complaint of lower back pain.  Reports that she has been under a lot of stress recently as well as doing many house chores such as vacuuming.  Feels muscle tightness across her lower back since yesterday.  Has tried Tylenol and ibuprofen without relief of symptoms.  Denies associated extremity weakness, numbness or tingling.  Denies bowel or bladder incontinence.  Denies fall/injury/trauma.     Past Medical History:  Diagnosis Date   Anxiety    Deaf     Patient Active Problem List   Diagnosis Date Noted   Adjustment disorder with anxious mood 11/22/2020    Past Surgical History:  Procedure Laterality Date   CHOLECYSTECTOMY     EYE SURGERY     TUMOR REMOVAL      Prior to Admission medications   Medication Sig Start Date End Date Taking? Authorizing Provider  HYDROcodone-acetaminophen (NORCO) 5-325 MG tablet Take 1 tablet by mouth every 6 (six) hours as needed for moderate pain. 06/19/21  Yes Irean Hong, MD  meloxicam (MOBIC) 7.5 MG tablet Take 1 tablet (7.5 mg total) by mouth daily for 7 days. 06/19/21 06/26/21 Yes Irean Hong, MD  methocarbamol (ROBAXIN) 500 MG tablet Take 1 tablet (500 mg total) by mouth every 8 (eight) hours as needed for muscle spasms. 06/19/21  Yes Irean Hong, MD  calamine lotion Apply 1 application topically as needed for itching. 05/11/15   Triplett, Cari B, FNP  cetirizine (ZYRTEC) 10 MG tablet Take 1 tablet (10 mg total) by mouth daily. 09/10/15   Cuthriell, Delorise Royals, PA-C  fluticasone (FLONASE) 50  MCG/ACT nasal spray Place 2 sprays into both nostrils daily. 10/22/15   Hagler, Jami L, PA-C    Allergies Patient has no known allergies.  History reviewed. No pertinent family history.  Social History Social History   Tobacco Use   Smoking status: Never   Smokeless tobacco: Never  Substance Use Topics   Alcohol use: Yes    Comment: occasional- wine   Drug use: No    Review of Systems  Constitutional: No fever/chills Eyes: No visual changes. ENT: No sore throat. Cardiovascular: Denies chest pain. Respiratory: Denies shortness of breath. Gastrointestinal: No abdominal pain.  No nausea, no vomiting.  No diarrhea.  No constipation. Genitourinary: Negative for dysuria. Musculoskeletal: Positive for back pain. Skin: Negative for rash. Neurological: Negative for headaches, focal weakness or numbness.   ____________________________________________   PHYSICAL EXAM:  VITAL SIGNS: ED Triage Vitals  Enc Vitals Group     BP 06/19/21 0235 (!) 171/94     Pulse Rate 06/19/21 0235 68     Resp 06/19/21 0235 18     Temp 06/19/21 0235 98 F (36.7 C)     Temp Source 06/19/21 0235 Oral     SpO2 06/19/21 0235 93 %     Weight 06/19/21 0232 180 lb (81.6 kg)     Height 06/19/21 0232 5\' 4"  (1.626 m)     Head Circumference --      Peak Flow --  Pain Score 06/19/21 0232 10     Pain Loc --      Pain Edu? --      Excl. in GC? --     Constitutional: Alert and oriented. Well appearing and in no acute distress. Eyes: Conjunctivae are normal. PERRL. EOMI. Head: Atraumatic. Nose: No congestion/rhinnorhea. Mouth/Throat: Mucous membranes are moist.   Neck: No stridor.   Cardiovascular: Normal rate, regular rhythm. Grossly normal heart sounds.  Good peripheral circulation. Respiratory: Normal respiratory effort.  No retractions. Lungs CTAB. Gastrointestinal: Soft and nontender. No distention. No abdominal bruits. No CVA tenderness. Musculoskeletal: No spinal tenderness to palpation.   Bilateral lumbar paraspinal muscle spasms.  Negative straight leg raise.  No lower extremity tenderness nor edema.  No joint effusions. Neurologic:  Normal speech and language. No gross focal neurologic deficits are appreciated. No gait instability. Skin:  Skin is warm, dry and intact. No rash noted. Psychiatric: Mood and affect are normal. Speech and behavior are normal.  ____________________________________________   LABS (all labs ordered are listed, but only abnormal results are displayed)  Labs Reviewed - No data to display ____________________________________________  EKG  None ____________________________________________  RADIOLOGY I, Morgen Linebaugh J, personally viewed and evaluated these images (plain radiographs) as part of my medical decision making, as well as reviewing the written report by the radiologist.  ED MD interpretation: None  Official radiology report(s): No results found.  ____________________________________________   PROCEDURES  Procedure(s) performed (including Critical Care):  Procedures   ____________________________________________   INITIAL IMPRESSION / ASSESSMENT AND PLAN / ED COURSE  As part of my medical decision making, I reviewed the following data within the electronic MEDICAL RECORD NUMBER Nursing notes reviewed and incorporated, Old chart reviewed, Notes from prior ED visits, and [place] Controlled Substance Database     70 year old female presenting with lumbar strain.  Will administer IM Toradol and Robaxin now.  Discharged home with as needed prescriptions for Mobic, Robaxin and Norco.  Orthopedics follow-up as needed.  Strict return precautions given.  Patient verbalizes understanding agrees with plan of care.      ____________________________________________   FINAL CLINICAL IMPRESSION(S) / ED DIAGNOSES  Final diagnoses:  Acute bilateral low back pain without sciatica  Strain of lumbar region, initial encounter     ED Discharge  Orders          Ordered    HYDROcodone-acetaminophen (NORCO) 5-325 MG tablet  Every 6 hours PRN        06/19/21 0346    methocarbamol (ROBAXIN) 500 MG tablet  Every 8 hours PRN        06/19/21 0346    meloxicam (MOBIC) 7.5 MG tablet  Daily        06/19/21 0346             Note:  This document was prepared using Dragon voice recognition software and may include unintentional dictation errors.    Irean Hong, MD 06/19/21 [phone]

## 2021-06-19 NOTE — ED Triage Notes (Signed)
Pt presents to ER c/o lower back pain that started after cleaning house yesterday.  Pt states her lower back feels very tight at this time and is very uncomfortable.  Pt ambulatory to triage at this time.

## 2021-06-19 NOTE — Discharge Instructions (Addendum)
Stop taking Ibuprofen.  Instead take Mobic daily as needed for pain, Norco as needed for more severe pain.  You may take Robaxin as needed for muscle spasms.  Apply moist heat to affected area several times daily.  Return to the ER for worsening symptoms, persistent vomiting, difficulty breathing or other concerns.

## 2021-08-22 ENCOUNTER — Emergency Department
Admission: EM | Admit: 2021-08-22 | Discharge: 2021-08-22 | Disposition: A | Discharge status: Home / Self Care | Payer: Federal, State, Local not specified - PPO | Attending: Emergency Medicine | Admitting: Emergency Medicine

## 2021-08-22 ENCOUNTER — Other Ambulatory Visit: Payer: Self-pay

## 2021-08-22 ENCOUNTER — Emergency Department: Payer: Federal, State, Local not specified - PPO

## 2021-08-22 DIAGNOSIS — R059 Cough, unspecified: Secondary | ICD-10-CM | POA: Diagnosis present

## 2021-08-22 DIAGNOSIS — J40 Bronchitis, not specified as acute or chronic: Secondary | ICD-10-CM | POA: Insufficient documentation

## 2021-08-22 MED ORDER — FLUTICASONE PROPIONATE 50 MCG/ACT NA SUSP: 1.0000 | Freq: Two times a day (BID) | NASAL | 0 refills | Status: AC

## 2021-08-22 MED ORDER — BENZONATATE 100 MG PO CAPS: 100.0000 mg | ORAL_CAPSULE | Freq: Three times a day (TID) | ORAL | 0 refills | Status: DC | PRN

## 2021-08-22 MED ORDER — PREDNISONE 50 MG PO TABS: 50.0000 mg | ORAL_TABLET | Freq: Every day | ORAL | 0 refills | Status: DC

## 2021-08-22 MED ORDER — ALBUTEROL SULFATE HFA 108 (90 BASE) MCG/ACT IN AERS: 2.0000 | INHALATION_SPRAY | RESPIRATORY_TRACT | 0 refills | Status: DC | PRN

## 2021-08-22 MED ORDER — AZITHROMYCIN 250 MG PO TABS: ORAL_TABLET | ORAL | 0 refills | Status: AC

## 2021-08-22 MED ORDER — FLUTICASONE PROPIONATE 50 MCG/ACT NA SUSP: 1.0000 | Freq: Two times a day (BID) | NASAL | 0 refills | Status: DC

## 2021-08-22 MED ORDER — LORATADINE 10 MG PO TBDP: 10.0000 mg | ORAL_TABLET | Freq: Every day | ORAL | 6 refills | Status: DC

## 2021-08-22 MED ORDER — LORATADINE 10 MG PO TBDP: 10.0000 mg | ORAL_TABLET | Freq: Every day | ORAL | 6 refills | Status: AC

## 2021-08-22 MED ORDER — AZITHROMYCIN 250 MG PO TABS: ORAL_TABLET | ORAL | 0 refills | Status: DC

## 2021-08-22 NOTE — ED Provider Notes (Signed)
[facility] Emergency Department Provider Note  ____________________________________________  Time seen: Approximately 3:45 PM  I have reviewed the triage vital signs and the nursing notes.   HISTORY  Chief Complaint Cough  ASL video interpreter was used  HPI Robin Stanley is a 70 y.o. female who presents to the emergency department complaining of cough times a month.  No fevers, no chills, no sore throat, no shortness of breath, chest pain GI symptoms.  She has had some associated nasal congestion for the entire time as well.  Patient is deaf, interpreter was used.  Patient is concerned with the length of coughing that she may have pneumonia.  Initially patient did not want a chest x-ray but does agree to a chest x-ray at this time.  Again no shortness of breath or chest pain.  No GI symptoms.  There is been no fever or other symptoms he even at the start of this current complaint.          Past Medical History:  Diagnosis Date   Anxiety    Deaf     Patient Active Problem List   Diagnosis Date Noted   Adjustment disorder with anxious mood 11/22/2020    Past Surgical History:  Procedure Laterality Date   CHOLECYSTECTOMY     EYE SURGERY     TUMOR REMOVAL      Prior to Admission medications   Medication Sig Start Date End Date Taking? Authorizing Provider  albuterol (VENTOLIN HFA) 108 (90 Base) MCG/ACT inhaler Inhale 2 puffs into the lungs every 4 (four) hours as needed for wheezing or shortness of breath. 08/22/21   Andriel Omalley, Delorise Royals, PA-C  azithromycin (ZITHROMAX Z-PAK) 250 MG tablet Take 2 tablets (500 mg) on  Day 1,  followed by 1 tablet (250 mg) once daily on Days 2 through 5. 08/22/21   Royale Lennartz, Delorise Royals, PA-C  benzonatate (TESSALON PERLES) 100 MG capsule Take 1 capsule (100 mg total) by mouth 3 (three) times daily as needed for cough. 08/22/21 08/22/22  Rainee Sweatt, Delorise Royals, PA-C  calamine lotion Apply 1 application topically as needed  for itching. 05/11/15   Triplett, Cari B, FNP  cetirizine (ZYRTEC) 10 MG tablet Take 1 tablet (10 mg total) by mouth daily. 09/10/15   Christion Leonhard, Delorise Royals, PA-C  fluticasone (FLONASE) 50 MCG/ACT nasal spray Place 1 spray into both nostrils 2 (two) times daily. 08/22/21   Nirvaan Frett, Delorise Royals, PA-C  HYDROcodone-acetaminophen (NORCO) 5-325 MG tablet Take 1 tablet by mouth every 6 (six) hours as needed for moderate pain. 06/19/21   Irean Hong, MD  loratadine (ALAVERT) 10 MG dissolvable tablet Take 1 tablet (10 mg total) by mouth daily. 08/22/21   Dia Donate, Delorise Royals, PA-C  methocarbamol (ROBAXIN) 500 MG tablet Take 1 tablet (500 mg total) by mouth every 8 (eight) hours as needed for muscle spasms. 06/19/21   Irean Hong, MD  predniSONE (DELTASONE) 50 MG tablet Take 1 tablet (50 mg total) by mouth daily with breakfast. 08/22/21   Susanna Benge, Delorise Royals, PA-C    Allergies Patient has no known allergies.  No family history on file.  Social History Social History   Tobacco Use   Smoking status: Never   Smokeless tobacco: Never  Substance Use Topics   Alcohol use: Yes    Comment: occasional- wine   Drug use: No     Review of Systems  Constitutional: No fever/chills Eyes: No visual changes. No discharge ENT: No upper respiratory complaints. Cardiovascular: no chest  pain. Respiratory: Positive cough. No SOB. Gastrointestinal: No abdominal pain.  No nausea, no vomiting.  No diarrhea.  No constipation. Musculoskeletal: Negative for musculoskeletal pain. Skin: Negative for rash, abrasions, lacerations, ecchymosis. Neurological: Negative for headaches, focal weakness or numbness.  10 System ROS otherwise negative.  ____________________________________________   PHYSICAL EXAM:  VITAL SIGNS: ED Triage Vitals  Enc Vitals Group     BP 08/22/21 1455 (!) 183/92     Pulse Rate 08/22/21 1455 82     Resp 08/22/21 1455 16     Temp 08/22/21 1455 98.5 F (36.9 C)     Temp Source 08/22/21  1455 Oral     SpO2 08/22/21 1455 94 %     Weight 08/22/21 1456 180 lb (81.6 kg)     Height 08/22/21 1456 5\' 4"  (1.626 m)     Head Circumference --      Peak Flow --      Pain Score 08/22/21 1456 0     Pain Loc --      Pain Edu? --      Excl. in GC? --      Constitutional: Alert and oriented. Well appearing and in no acute distress. Eyes: Conjunctivae are normal. PERRL. EOMI. Head: Atraumatic. ENT:      Ears:       Nose: No congestion/rhinnorhea.      Mouth/Throat: Mucous membranes are moist.  Neck: No stridor.   Hematological/Lymphatic/Immunilogical: No cervical lymphadenopathy. Cardiovascular: Normal rate, regular rhythm. Normal S1 and S2.  Good peripheral circulation. Respiratory: Normal respiratory effort without tachypnea or retractions. Lungs CTAB with no appreciable wheezing, rales or rhonchi. Good air entry to the bases with no decreased or absent breath sounds. Musculoskeletal: Full range of motion to all extremities. No gross deformities appreciated. Neurologic:  Normal speech and language. No gross focal neurologic deficits are appreciated.  Skin:  Skin is warm, dry and intact. No rash noted. Psychiatric: Mood and affect are normal. Speech and behavior are normal. Patient exhibits appropriate insight and judgement.   ____________________________________________   LABS (all labs ordered are listed, but only abnormal results are displayed)  Labs Reviewed - No data to display ____________________________________________  EKG   ____________________________________________  RADIOLOGY I personally viewed and evaluated these images as part of my medical decision making, as well as reviewing the written report by the radiologist.  ED Provider Interpretation: No acute consolidation concerning for pneumonia.  DG Chest 2 View  Result Date: 08/22/2021 CLINICAL DATA:  Cough for 1 month EXAM: CHEST - 2 VIEW COMPARISON:  None. FINDINGS: Cardiac and mediastinal contours  are within normal limits. No focal pulmonary opacity. No pleural effusion or pneumothorax. No acute osseous abnormality. IMPRESSION: No acute cardiopulmonary process. Electronically Signed   By: 14/05/2021 M.D.   On: 08/22/2021 16:26    ____________________________________________    PROCEDURES  Procedure(s) performed:    Procedures    Medications - No data to display   ____________________________________________   INITIAL IMPRESSION / ASSESSMENT AND PLAN / ED COURSE  Pertinent labs & imaging results that were available during my care of the patient were reviewed by me and considered in my medical decision making (see chart for details).  Review of the [place] CSRS was performed in accordance of the [facility] prior to dispensing any controlled drugs.           Patient's diagnosis is consistent with bronchitis.  Patient presents to the emergency department complaining of a cough x1 month.  Patient initially did  not want a chest x-ray but ultimately agreed to same.  There is no acute findings on chest x-ray.  I suspect bronchitis from the nature of the cough the patient has as well as no other significant associated symptoms.  Patient is adamant that she would like an antibiotic given the length I do feel it is reasonable to prescribe a Z-Pak for the patient.  I will prescribe prednisone, Z-Pak, albuterol, Tessalon Perles, Flonase and loratadine for the patient.  Follow-up with primary care as needed.  Return precautions discussed with the patient.. Patient is given ED precautions to return to the ED for any worsening or new symptoms.     ____________________________________________  FINAL CLINICAL IMPRESSION(S) / ED DIAGNOSES  Final diagnoses:  Bronchitis      NEW MEDICATIONS STARTED DURING THIS VISIT:  ED Discharge Orders          Ordered    loratadine (ALAVERT) 10 MG dissolvable tablet  Daily,   Status:  Discontinued        08/22/21 1647    fluticasone (FLONASE) 50  MCG/ACT nasal spray  2 times daily,   Status:  Discontinued        08/22/21 1647    benzonatate (TESSALON PERLES) 100 MG capsule  3 times daily PRN,   Status:  Discontinued        08/22/21 1647    predniSONE (DELTASONE) 50 MG tablet  Daily with breakfast,   Status:  Discontinued        08/22/21 1647    albuterol (VENTOLIN HFA) 108 (90 Base) MCG/ACT inhaler  Every 4 hours PRN,   Status:  Discontinued        08/22/21 1647    azithromycin (ZITHROMAX Z-PAK) 250 MG tablet  Status:  Discontinued        08/22/21 1647    albuterol (VENTOLIN HFA) 108 (90 Base) MCG/ACT inhaler  Every 4 hours PRN        08/22/21 1650    azithromycin (ZITHROMAX Z-PAK) 250 MG tablet        08/22/21 1650    benzonatate (TESSALON PERLES) 100 MG capsule  3 times daily PRN        08/22/21 1650    fluticasone (FLONASE) 50 MCG/ACT nasal spray  2 times daily        08/22/21 1650    loratadine (ALAVERT) 10 MG dissolvable tablet  Daily        08/22/21 1650    predniSONE (DELTASONE) 50 MG tablet  Daily with breakfast        08/22/21 1650                This chart was dictated using voice recognition software/Dragon. Despite best efforts to proofread, errors can occur which can change the meaning. Any change was purely unintentional.    Racheal Patches, PA-C 08/22/21 1653    Phineas Semen, MD 08/22/21 1755

## 2021-08-22 NOTE — ED Triage Notes (Signed)
Pt here for a cough for a month, and sinus congestion,  sign language interpreter used, pt denies fever

## 2021-09-09 ENCOUNTER — Encounter: Payer: Self-pay | Admitting: Emergency Medicine

## 2021-09-09 ENCOUNTER — Emergency Department
Admission: EM | Admit: 2021-09-09 | Discharge: 2021-09-09 | Disposition: A | Discharge status: Home / Self Care | Payer: Federal, State, Local not specified - PPO | Attending: Emergency Medicine | Admitting: Emergency Medicine

## 2021-09-09 ENCOUNTER — Other Ambulatory Visit: Payer: Self-pay

## 2021-09-09 ENCOUNTER — Emergency Department: Payer: Federal, State, Local not specified - PPO

## 2021-09-09 DIAGNOSIS — J0110 Acute frontal sinusitis, unspecified: Secondary | ICD-10-CM | POA: Insufficient documentation

## 2021-09-09 DIAGNOSIS — Z7951 Long term (current) use of inhaled steroids: Secondary | ICD-10-CM | POA: Diagnosis not present

## 2021-09-09 DIAGNOSIS — J4521 Mild intermittent asthma with (acute) exacerbation: Secondary | ICD-10-CM | POA: Insufficient documentation

## 2021-09-09 DIAGNOSIS — R059 Cough, unspecified: Secondary | ICD-10-CM | POA: Diagnosis present

## 2021-09-09 MED ORDER — AMOXICILLIN-POT CLAVULANATE 875-125 MG PO TABS
1.0000 | ORAL_TABLET | Freq: Once | ORAL | Status: AC
  Administered 2021-09-09: 23:00:00 1 via ORAL
  Filled 2021-09-09: qty 1

## 2021-09-09 MED ORDER — BENZONATATE 100 MG PO CAPS: 100.0000 mg | ORAL_CAPSULE | Freq: Four times a day (QID) | ORAL | 0 refills | Status: AC | PRN

## 2021-09-09 MED ORDER — AMOXICILLIN-POT CLAVULANATE 875-125 MG PO TABS: 1.0000 | ORAL_TABLET | Freq: Two times a day (BID) | ORAL | 0 refills | Status: AC

## 2021-09-09 MED ORDER — IPRATROPIUM-ALBUTEROL 0.5-2.5 (3) MG/3ML IN SOLN
3.0000 mL | Freq: Once | RESPIRATORY_TRACT | Status: AC
  Administered 2021-09-09: 22:00:00 3 mL via RESPIRATORY_TRACT
  Filled 2021-09-09: qty 3

## 2021-09-09 MED ORDER — ALBUTEROL SULFATE 1.25 MG/3ML IN NEBU: 1.0000 | INHALATION_SOLUTION | Freq: Four times a day (QID) | RESPIRATORY_TRACT | 0 refills | Status: DC | PRN

## 2021-09-09 NOTE — ED Provider Notes (Signed)
[facility] EMERGENCY DEPARTMENT Provider Note   CSN: [id number] Arrival date & time: 09/09/21  1743     History Chief Complaint  Patient presents with   Cough    Robin Stanley is a 70 y.o. female presents to the emergency department for evaluation of sinus pain, pressure, wheezing, cough.  Symptoms been present for several weeks, was seen a couple weeks ago and placed on Z-Pak, Tessalon Perles and saw significant relief but after completing the medication symptoms returned.  She denies any chest pain or shortness of breath but does have some wheezing and dry cough.  She also complains of a lot of sinus pain pressure and drainage.  Patient does not have care with PCP.  She also notes 2-1/2 years of heavy menstrual periods with significant cramping that occur monthly the last 1 to 2 weeks.  She denies any current vaginal bleeding, dizziness lightheadedness chest pain or shortness of breath.  No current pelvic or abdominal pain or cramping.  She is scheduled for GYN visit in February but would like to be seen sooner.  HPI     Past Medical History:  Diagnosis Date   Anxiety    Deaf     Patient Active Problem List   Diagnosis Date Noted   Adjustment disorder with anxious mood 11/22/2020    Past Surgical History:  Procedure Laterality Date   CHOLECYSTECTOMY     EYE SURGERY     TUMOR REMOVAL       OB History   No obstetric history on file.     History reviewed. No pertinent family history.  Social History   Tobacco Use   Smoking status: Never   Smokeless tobacco: Never  Substance Use Topics   Alcohol use: Yes    Comment: occasional- wine   Drug use: No    Home Medications Prior to Admission medications   Medication Sig Start Date End Date Taking? Authorizing Provider  albuterol (ACCUNEB) 1.25 MG/3ML nebulizer solution Take 3 mLs (1.25 mg total) by nebulization every 6 (six) hours as needed for wheezing. 09/09/21  Yes Evon Slack, PA-C   amoxicillin-clavulanate (AUGMENTIN) 875-125 MG tablet Take 1 tablet by mouth every 12 (twelve) hours for 7 days. 09/09/21 09/16/21 Yes Evon Slack, PA-C  benzonatate (TESSALON PERLES) 100 MG capsule Take 1 capsule (100 mg total) by mouth every 6 (six) hours as needed for cough. 09/09/21 09/09/22 Yes Evon Slack, PA-C  azithromycin (ZITHROMAX Z-PAK) 250 MG tablet Take 2 tablets (500 mg) on  Day 1,  followed by 1 tablet (250 mg) once daily on Days 2 through 5. 08/22/21   Cuthriell, Delorise Royals, PA-C  calamine lotion Apply 1 application topically as needed for itching. 05/11/15   Triplett, Cari B, FNP  cetirizine (ZYRTEC) 10 MG tablet Take 1 tablet (10 mg total) by mouth daily. 09/10/15   Cuthriell, Delorise Royals, PA-C  fluticasone (FLONASE) 50 MCG/ACT nasal spray Place 1 spray into both nostrils 2 (two) times daily. 08/22/21   Cuthriell, Delorise Royals, PA-C  HYDROcodone-acetaminophen (NORCO) 5-325 MG tablet Take 1 tablet by mouth every 6 (six) hours as needed for moderate pain. 06/19/21   Irean Hong, MD  loratadine (ALAVERT) 10 MG dissolvable tablet Take 1 tablet (10 mg total) by mouth daily. 08/22/21   Cuthriell, Delorise Royals, PA-C  methocarbamol (ROBAXIN) 500 MG tablet Take 1 tablet (500 mg total) by mouth every 8 (eight) hours as needed for muscle spasms. 06/19/21   Irean Hong,  MD  predniSONE (DELTASONE) 50 MG tablet Take 1 tablet (50 mg total) by mouth daily with breakfast. 08/22/21   Cuthriell, Delorise Royals, PA-C    Allergies    Patient has no known allergies.  Review of Systems   Review of Systems  Constitutional:  Negative for chills and fever.  HENT:  Positive for sinus pressure and sinus pain. Negative for congestion, sore throat, tinnitus and trouble swallowing.   Respiratory:  Positive for cough and wheezing. Negative for stridor.   Cardiovascular:  Negative for chest pain.  Gastrointestinal:  Negative for abdominal pain, nausea and vomiting.  Genitourinary:  Positive for menstrual  problem. Negative for pelvic pain.  Musculoskeletal:  Negative for back pain.  Skin:  Negative for rash and wound.  Neurological:  Negative for dizziness, numbness and headaches.   Physical Exam Updated Vital Signs BP (!) 154/86 (BP Location: Left Arm)    Pulse 77    Temp 98.4 F (36.9 C) (Oral)    Resp 18    Ht 5\' 4"  (1.626 m)    Wt 81.6 kg    SpO2 94%    BMI 30.90 kg/m   Physical Exam Constitutional:      Appearance: She is well-developed.  HENT:     Head: Normocephalic and atraumatic.     Comments: Positive frontal sinus tenderness    Right Ear: There is no impacted cerumen.     Nose: Nose normal.     Mouth/Throat:     Mouth: Mucous membranes are moist.     Pharynx: No oropharyngeal exudate or posterior oropharyngeal erythema.  Eyes:     Conjunctiva/sclera: Conjunctivae normal.  Cardiovascular:     Rate and Rhythm: Normal rate.  Pulmonary:     Effort: Pulmonary effort is normal. No respiratory distress.     Breath sounds: Wheezing present.     Comments: Subtle expiratory wheezing resolved with DuoNeb treatment. Abdominal:     General: There is no distension.     Tenderness: There is no abdominal tenderness. There is no guarding.  Musculoskeletal:        General: No swelling or tenderness. Normal range of motion.     Cervical back: Normal range of motion.  Skin:    General: Skin is warm.     Capillary Refill: Capillary refill takes less than 2 seconds.     Findings: No rash.  Neurological:     General: No focal deficit present.     Mental Status: She is alert and oriented to person, place, and time.     Cranial Nerves: No cranial nerve deficit.     Motor: No weakness.     Gait: Gait normal.  Psychiatric:        Behavior: Behavior normal.        Thought Content: Thought content normal.    ED Results / Procedures / Treatments   Labs (all labs ordered are listed, but only abnormal results are displayed) Labs Reviewed - No data to  display  EKG None  Radiology DG Chest 2 View  Result Date: 09/09/2021 CLINICAL DATA:  Cough EXAM: CHEST - 2 VIEW COMPARISON:  08/22/2021 FINDINGS: The heart size and mediastinal contours are within normal limits. Both lungs are clear. The visualized skeletal structures are unremarkable. IMPRESSION: No active cardiopulmonary disease. Electronically Signed   By: 14/05/2021 M.D.   On: 09/09/2021 22:08    Procedures Procedures   Medications Ordered in ED Medications  amoxicillin-clavulanate (AUGMENTIN) 875-125 MG per tablet 1 tablet (  has no administration in time range)  ipratropium-albuterol (DUONEB) 0.5-2.5 (3) MG/3ML nebulizer solution 3 mL (3 mLs Nebulization Given 09/09/21 2207)    ED Course  I have reviewed the triage vital signs and the nursing notes.  Pertinent labs & imaging results that were available during my care of the patient were reviewed by me and considered in my medical decision making (see chart for details).    MDM Rules/Calculators/A&P                         70 year old female with cough, wheeze, sinus pain and pressure.  Wheezing resolved with DuoNeb treatment.  She is given prescription for albuterol nebulizer liquid to use at home, daughter does have a nebulizer machine.  She is also given a prescription for Augmentin for sinusitis.  She will continue with Tessalon Perles.  Patient's vital signs are stable, chest x-ray negative for any acute cardiopulmonary process.  Patient given information to follow-up with OB/GYN physician due to abnormal uterine bleeding.  Patient currently with no active bleeding or pain.  Final Clinical Impression(s) / ED Diagnoses Final diagnoses:  Mild intermittent asthma with exacerbation  Acute non-recurrent frontal sinusitis    Rx / DC Orders ED Discharge Orders          Ordered    albuterol (ACCUNEB) 1.25 MG/3ML nebulizer solution  Every 6 hours PRN        09/09/21 2248    amoxicillin-clavulanate (AUGMENTIN) 875-125 MG  tablet  Every 12 hours        09/09/21 2248    benzonatate (TESSALON PERLES) 100 MG capsule  Every 6 hours PRN        09/09/21 2248             Evon Slack, PA-C 09/09/21 2257    Sharyn Creamer, MD 09/14/21 2330

## 2021-09-09 NOTE — ED Notes (Signed)
Pt requested not to have discharge vitals taken at this time.

## 2021-09-09 NOTE — ED Provider Notes (Signed)
Emergency Medicine Provider Triage Evaluation Note  Robin Stanley , a 70 y.o. female  was evaluated in triage.  Pt complains of turns to the ED for evaluation of persistent cough.  Patient was evaluated 3 weeks ago for similar complaints.  She was discharged with multiple prescriptions at that time patient denies any interim fevers, chills, or sweats.  Review of Systems  Positive: cough Negative: FCS  Physical Exam  BP (!) 154/86 (BP Location: Left Arm)    Pulse 77    Temp 98.4 F (36.9 C) (Oral)    Resp 18    Ht 5\' 4"  (1.626 m)    Wt 81.6 kg    SpO2 94%    BMI 30.90 kg/m  Gen:   Awake, no distress  NAD Resp:  Normal effort CTA MSK:   Moves extremities without difficulty  Other:  CVS: RRR  Medical Decision Making  Medically screening exam initiated at 7:08 PM.  Appropriate orders placed.  [name] was informed that the remainder of the evaluation will be completed by another provider, this initial triage assessment does not replace that evaluation, and the importance of remaining in the ED until their evaluation is complete.  Patient with ED evaluation of persistent cough.  Patient recently diagnosed with bronchitis and treated with several medications including antibiotics and steroids at that time.  She denies any fever chills or sweats.   [name], PA-C 09/09/21 1911    09/11/21, MD 09/09/21 [phone]

## 2021-09-09 NOTE — Discharge Instructions (Signed)
Please take medication as prescribed.  Call PCP to establish care.  You may also call in [facility] to discuss menstrual cycles.  Please return to the ER for any worsening symptoms or urgent changes in your health such as fevers, shortness of breath.

## 2021-09-09 NOTE — ED Notes (Signed)
Per previous EDT Amber, pt refused EKG.

## 2021-09-09 NOTE — ED Triage Notes (Addendum)
Triage per ASL interpreter  Pt c/o continued cough, pt states she was recently coughing, pt states she needs more antibiotics and more cough medicine.   Pt states the cough started on 12/23 and denies any fevers.  Dx with bronchitis on 12/9 in the ED.

## 2021-11-13 ENCOUNTER — Other Ambulatory Visit: Payer: Self-pay

## 2021-11-13 ENCOUNTER — Emergency Department
Admission: EM | Admit: 2021-11-13 | Discharge: 2021-11-13 | Disposition: A | Discharge status: Home / Self Care | Payer: Federal, State, Local not specified - PPO | Attending: Emergency Medicine | Admitting: Emergency Medicine

## 2021-11-13 DIAGNOSIS — R059 Cough, unspecified: Secondary | ICD-10-CM

## 2021-11-13 DIAGNOSIS — R062 Wheezing: Secondary | ICD-10-CM

## 2021-11-13 DIAGNOSIS — J209 Acute bronchitis, unspecified: Secondary | ICD-10-CM | POA: Insufficient documentation

## 2021-11-13 DIAGNOSIS — J40 Bronchitis, not specified as acute or chronic: Secondary | ICD-10-CM

## 2021-11-13 MED ORDER — IPRATROPIUM-ALBUTEROL 0.5-2.5 (3) MG/3ML IN SOLN
3.0000 mL | Freq: Once | RESPIRATORY_TRACT | Status: AC
  Administered 2021-11-13: 3 mL via RESPIRATORY_TRACT
  Filled 2021-11-13: qty 3

## 2021-11-13 MED ORDER — ALBUTEROL SULFATE HFA 108 (90 BASE) MCG/ACT IN AERS: 2.0000 | INHALATION_SPRAY | Freq: Four times a day (QID) | RESPIRATORY_TRACT | 2 refills | Status: AC | PRN

## 2021-11-13 MED ORDER — HYDROCOD POLI-CHLORPHE POLI ER 10-8 MG/5ML PO SUER: 5.0000 mL | Freq: Every evening | ORAL | 0 refills | Status: AC | PRN

## 2021-11-13 MED ORDER — PREDNISONE 20 MG PO TABS
60.0000 mg | ORAL_TABLET | Freq: Once | ORAL | Status: AC
  Administered 2021-11-13: 60 mg via ORAL
  Filled 2021-11-13: qty 3

## 2021-11-13 MED ORDER — PREDNISONE 10 MG PO TABS: 10.0000 mg | ORAL_TABLET | Freq: Every day | ORAL | 0 refills | Status: AC

## 2021-11-13 NOTE — ED Provider Notes (Signed)
?[facility] EMERGENCY DEPARTMENT ?Provider Note ? ? ?CSN: [id number] ?Arrival date & time: 11/13/21  1833 ? ?  ? ?History ? ?Chief Complaint  ?Patient presents with  ? Cough  ? ? ?Robin Stanley is a 71 y.o. female presents to the emergency department for evaluation of cough.  She has had a cough for greater than 1 month.  She was at [facility] a month ago, states she was admitted.  Imaging reviewed by me showing negative work-up for flu/COVID, negative CTA of the chest.  She states her cough has been persistent but improving.  She describes some nighttime wheezing that comes and goes.  She is requesting a breathing treatment, states she is without her albuterol inhaler.  She is also requesting nighttime cough medication.  She denies any chest pain, shortness of breath.  No fevers.  Her cough overall has been improving over the last few weeks.  She denies any chest pain ? ?HPI ? ?  ? ?Home Medications ?Prior to Admission medications   ?Medication Sig Start Date End Date Taking? Authorizing Provider  ?albuterol (VENTOLIN HFA) 108 (90 Base) MCG/ACT inhaler Inhale 2 puffs into the lungs every 6 (six) hours as needed for wheezing or shortness of breath. 11/13/21  Yes Evon Slack, PA-C  ?chlorpheniramine-HYDROcodone (TUSSIONEX PENNKINETIC ER) 10-8 MG/5ML Take 5 mLs by mouth at bedtime as needed for cough. 11/13/21  Yes Evon Slack, PA-C  ?predniSONE (DELTASONE) 10 MG tablet Take 1 tablet (10 mg total) by mouth daily. 6,5,4,3,2,1 six day taper 11/13/21  Yes Evon Slack, PA-C  ?azithromycin (ZITHROMAX Z-PAK) 250 MG tablet Take 2 tablets (500 mg) on  Day 1,  followed by 1 tablet (250 mg) once daily on Days 2 through 5. 08/22/21   Cuthriell, Delorise Royals, PA-C  ?benzonatate (TESSALON PERLES) 100 MG capsule Take 1 capsule (100 mg total) by mouth every 6 (six) hours as needed for cough. 09/09/21 09/09/22  Evon Slack, PA-C  ?calamine lotion Apply 1 application topically as needed for itching. 05/11/15    Chinita Pester, FNP  ?cetirizine (ZYRTEC) 10 MG tablet Take 1 tablet (10 mg total) by mouth daily. 09/10/15   Cuthriell, Delorise Royals, PA-C  ?fluticasone (FLONASE) 50 MCG/ACT nasal spray Place 1 spray into both nostrils 2 (two) times daily. 08/22/21   Cuthriell, Delorise Royals, PA-C  ?HYDROcodone-acetaminophen (NORCO) 5-325 MG tablet Take 1 tablet by mouth every 6 (six) hours as needed for moderate pain. 06/19/21   Irean Hong, MD  ?loratadine (ALAVERT) 10 MG dissolvable tablet Take 1 tablet (10 mg total) by mouth daily. 08/22/21   Cuthriell, Delorise Royals, PA-C  ?methocarbamol (ROBAXIN) 500 MG tablet Take 1 tablet (500 mg total) by mouth every 8 (eight) hours as needed for muscle spasms. 06/19/21   Irean Hong, MD  ?   ? ?Allergies    ?Patient has no known allergies.   ? ?Review of Systems   ?Review of Systems ? ?Physical Exam ?Updated Vital Signs ?BP (!) 145/97 (BP Location: Left Arm)   Pulse 80   Temp 98 ?F (36.7 ?C) (Oral)   Resp 18   Ht 5\' 4"  (1.626 m)   Wt 88 kg   SpO2 94%   BMI 33.30 kg/m?  ?Physical Exam ?Constitutional:   ?   Appearance: She is well-developed.  ?HENT:  ?   Head: Normocephalic and atraumatic.  ?   Nose: Nose normal.  ?   Mouth/Throat:  ?   Pharynx: No oropharyngeal  exudate or posterior oropharyngeal erythema.  ?Eyes:  ?   Conjunctiva/sclera: Conjunctivae normal.  ?   Pupils: Pupils are equal, round, and reactive to light.  ?Cardiovascular:  ?   Rate and Rhythm: Normal rate.  ?Pulmonary:  ?   Effort: Pulmonary effort is normal. No respiratory distress.  ?   Breath sounds: No stridor. Wheezing present. No rhonchi or rales.  ?Abdominal:  ?   General: There is no distension.  ?   Tenderness: There is no abdominal tenderness. There is no guarding.  ?Musculoskeletal:     ?   General: Normal range of motion.  ?   Cervical back: Normal range of motion.  ?Skin: ?   General: Skin is warm.  ?   Findings: No rash.  ?Neurological:  ?   Mental Status: She is alert and oriented to person, place, and time.   ?Psychiatric:     ?   Behavior: Behavior normal.     ?   Thought Content: Thought content normal.  ? ? ?ED Results / Procedures / Treatments   ?Labs ?(all labs ordered are listed, but only abnormal results are displayed) ?Labs Reviewed - No data to display ? ?EKG ?None ? ?Radiology ?No results found. ? ?Procedures ?Procedures  ? ? ?Medications Ordered in ED ?Medications  ?ipratropium-albuterol (DUONEB) 0.5-2.5 (3) MG/3ML nebulizer solution 3 mL (3 mLs Nebulization Given 11/13/21 2134)  ?predniSONE (DELTASONE) tablet 60 mg (60 mg Oral Given 11/13/21 2133)  ? ? ?ED Course/ Medical Decision Making/ A&P ?  ?                        ?Medical Decision Making ?Risk ?Prescription drug management. ? ?71 year old female with cough for greater than 1 month.  Records and imaging reviewed from [facility] nearly 1 month ago, negative CTA of the chest and negative work-up for COVID and flu.  Patient's cough has been improving but at nighttime she has been having some mild wheezing and persistent cough.  She denies any productive cough, fevers, chills or shortness of breath.  On exam she is noted to have some slight expiratory wheezing, 1 DuoNeb treatment was given with significant improvement and resolution of her wheezing.  She was given oral steroids and patient is vital signs are stable.  She is stable and ready for discharge to home with albuterol inhaler, prednisone taper and nighttime cough medication.  She understands signs and symptoms return to the ER for. ?Final Clinical Impression(s) / ED Diagnoses ?Final diagnoses:  ?Cough  ?Bronchitis  ?Wheezing  ? ? ?Rx / DC Orders ?ED Discharge Orders   ? ?      Ordered  ?  predniSONE (DELTASONE) 10 MG tablet  Daily       ? 11/13/21 2201  ?  albuterol (VENTOLIN HFA) 108 (90 Base) MCG/ACT inhaler  Every 6 hours PRN       ? 11/13/21 2201  ?  chlorpheniramine-HYDROcodone (TUSSIONEX PENNKINETIC ER) 10-8 MG/5ML  At bedtime PRN       ? 11/13/21 2201  ? ?  ?  ? ?  ? ? ?  ?Evon Slack,  PA-C ?11/13/21 2202 ? ?  ?Concha Se, MD ?11/14/21 1148 ? ?

## 2021-11-13 NOTE — ED Notes (Signed)
Pt refuses any testing at this time.  States she is only here to get cough medicine.   ?

## 2021-11-13 NOTE — Discharge Instructions (Signed)
Please take medications as prescribed and return to the ER for any increasing pain fevers, shortness of breath, worsening symptoms or to changes in health ?

## 2021-11-13 NOTE — ED Triage Notes (Signed)
Pt presents to ER c/o cough that has been ongoing since thanksgiving last year.  Cough has been getting better and then worse per patient.  Pt states cough is worse when sleeping.  Pt also endorses some "strange feeling" to the left side of her tongue pt A&O x4 at this time in NAD.   ?

## 2022-01-21 ENCOUNTER — Emergency Department
Admission: EM | Admit: 2022-01-21 | Discharge: 2022-01-21 | Discharge status: Against Medical Advice | Payer: Federal, State, Local not specified - PPO | Attending: Emergency Medicine | Admitting: Emergency Medicine

## 2022-01-21 DIAGNOSIS — K0889 Other specified disorders of teeth and supporting structures: Secondary | ICD-10-CM | POA: Diagnosis present

## 2022-01-21 DIAGNOSIS — Z5321 Procedure and treatment not carried out due to patient leaving prior to being seen by health care provider: Secondary | ICD-10-CM | POA: Insufficient documentation

## 2022-01-21 NOTE — ED Triage Notes (Signed)
71 [facility]/o [facility]. Pt states it has been going on since that past Saturday. Pt denies any drainage or fevers. Pt states some slight swelling on the left side of face. Pt denies left ear pain. Pt notes she has taken 3x 500mg  of tylenol and 1x 800 motrin. 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

## 2022-01-21 NOTE — ED Notes (Signed)
No answer when called several times from lobby 

## 2022-06-05 ENCOUNTER — Other Ambulatory Visit: Payer: Self-pay

## 2022-06-05 ENCOUNTER — Encounter: Payer: Self-pay | Admitting: Emergency Medicine

## 2022-06-05 ENCOUNTER — Emergency Department
Admission: EM | Admit: 2022-06-05 | Discharge: 2022-06-05 | Disposition: A | Discharge status: Home / Self Care | Payer: Federal, State, Local not specified - PPO | Attending: Emergency Medicine | Admitting: Emergency Medicine

## 2022-06-05 DIAGNOSIS — U071 COVID-19: Secondary | ICD-10-CM | POA: Diagnosis not present

## 2022-06-05 DIAGNOSIS — R0981 Nasal congestion: Secondary | ICD-10-CM | POA: Diagnosis present

## 2022-06-05 HISTORY — DX: Essential (primary) hypertension: I10

## 2022-06-05 LAB — RESP PANEL BY RT-PCR (FLU A&B, COVID) ARPGX2
Influenza A by PCR: NEGATIVE
Influenza B by PCR: NEGATIVE
SARS Coronavirus 2 by RT PCR: POSITIVE — AB

## 2022-06-05 LAB — GROUP A STREP BY PCR: Group A Strep by PCR: NOT DETECTED

## 2022-06-05 MED ORDER — NIRMATRELVIR/RITONAVIR (PAXLOVID)TABLET: 3.0000 | ORAL_TABLET | Freq: Two times a day (BID) | ORAL | 0 refills | Status: AC

## 2022-06-05 NOTE — ED Provider Notes (Signed)
[facility] Provider Note    Event Date/Time   First MD Initiated Contact with Patient 06/05/22 1149     (approximate)   History   No chief complaint on file.  ASL interpreter utilized throughout, video  HPI  Robin Stanley is a 71 y.o. female   history of hypertension previous cholecystectomy  For 3 days has been experiencing a sore scratchy throat.  Had a fever yesterday.  Still able to eat and drink without difficulty but it does hurt to swallow.  Starting to feel better today.  She came because she had discussed with her daughter who recommended she get checked to make sure it is not strep throat which would need an antibiotic  She has had no vomiting.  No cough.  Her sinuses have felt slightly congested, and she tried taking loratadine yesterday without relief.  She also had been searching out Alavert, but was not able to find it at her local pharmacy which she has used before as well (reviewed with patient, and discussed that loratadine is essentially the same medication as Alavert)  No chest pain.  No fever today.  Tylenol and ibuprofen have been helpful in controlling her sore throat      Physical Exam   Triage Vital Signs: ED Triage Vitals  Enc Vitals Group     BP 06/05/22 1139 (!) 153/90     Pulse Rate 06/05/22 1139 86     Resp 06/05/22 1139 16     Temp 06/05/22 1139 98.4 F (36.9 C)     Temp Source 06/05/22 1139 Oral     SpO2 06/05/22 1139 97 %     Weight 06/05/22 1140 179 lb 14.3 oz (81.6 kg)     Height --      Head Circumference --      Peak Flow --      Pain Score 06/05/22 1140 6     Pain Loc --      Pain Edu? --      Excl. in [place]? --     Most recent vital signs: Vitals:   06/05/22 1139  BP: (!) 153/90  Pulse: 86  Resp: 16  Temp: 98.4 F (36.9 C)  SpO2: 97%     General: Awake, no distress.  Very pleasant.  Communicates well with ASL interpreter  Normocephalic atraumatic.  No facial swelling or rash.  No noted  tonsillar hypertrophy or exudates.  The tonsillar pillars appear mildly erythematous, and the posterior oropharynx is slightly erythematous as well.  No tonsillar spots.  No strawberry tongue.  Very slight shotty anterior cervical adenopathy.  Normal appearance of the neck.  No stridor.  Respirations without difficulty.  No difficulty swallowing.  CV:  Good peripheral perfusion.  Normal heart tones and rate Resp:  Normal effort.  Clear bilateral.  No cough or distress Abd:  No distention.  Other:  Stands, pivots and walks to bed without difficulty.   ED Results / Procedures / Treatments   Labs (all labs ordered are listed, but only abnormal results are displayed) Labs Reviewed  RESP PANEL BY RT-PCR (FLU A&B, COVID) ARPGX2 - Abnormal; Notable for the following components:      Result Value   SARS Coronavirus 2 by RT PCR POSITIVE (*)    All other components within normal limits  GROUP A STREP BY PCR     EKG     RADIOLOGY  No indication for imaging to noted.  No pulmonary complaint.  Clinical  examination clinical history and evaluation do not have elevated suspicion for retropharyngeal abscess or acute abscess or mass lesion at this time.   PROCEDURES:  Critical Care performed: No  Procedures   MEDICATIONS ORDERED IN ED: Medications - No data to display   IMPRESSION / MDM / ASSESSMENT AND PLAN / ED COURSE  I reviewed the triage vital signs and the nursing notes.                              Differential diagnosis includes, but is not limited to, suspect likely viral upper respiratory infection but also wish to exclude cause such as strep, COVID, influenza which could potentially be treated with antibiotic or antiviral.  No evidence of retropharyngeal abscess.  Normal tonsillar pillars except for mild erythema.  No shift no masses.  Swallowing secretions without difficulty and reports her symptoms actually are starting to improve today compared with  yesterday.  Patient's presentation is most consistent with acute complicated illness / injury requiring diagnostic workup.    Clinical Course as of 06/05/22 1430  Fri Jun 05, 2022  1424 Reviewed with pharmacist Thereasa Distance). GFR > 30. No medication contraindications to Paxlovid per pharmacist and also on my review.  [MQ]    Clinical Course User Index [MQ] Sharyn Creamer, MD   ----------------------------------------- 2:30 PM on 06/05/2022 ----------------------------------------- Labs interpreted as positive for COVID-19.  At this juncture patient's symptoms seem to be suggestive of relatively mild symptoms at this point 3 days into illness of COVID-19 including pharyngitis and sinusitis.  I do not have evidence of highly suggest acute bacterial illness.  She has no pulmonary symptoms.  She does however have risk factors including age and history of hypertension.  Discussed with patient via ASL interpreter and she would like to be treated with antiviral for which I have reviewed Paxlovid with our pharmacist.  I have sent prescription in and advised patient on careful return precautions as well as discussed side effects and potential side effects and risks and benefits of Paxlovid prior to prescribing.  Patient would like to proceed with Paxlovid prescription.  Offered to communicate with the patient's daughter, but the patient's daughter not presently available for video chat (which patient needs due to ASL).  Return precautions and treatment recommendations and follow-up discussed with the patient who is agreeable with the plan.    FINAL CLINICAL IMPRESSION(S) / ED DIAGNOSES   Final diagnoses:  COVID-19     Rx / DC Orders   ED Discharge Orders          Ordered    nirmatrelvir/ritonavir EUA (PAXLOVID) 20 x 150 MG & 10 x 100MG  TABS  2 times daily        06/05/22 1427             Note:  This document was prepared using Dragon voice recognition software and may include  unintentional dictation errors.   06/07/22, MD 06/05/22 1432

## 2022-06-05 NOTE — ED Notes (Signed)
Pt c/o sore throat

## 2022-06-05 NOTE — ED Triage Notes (Signed)
Arrives with c/o sore throat for the past several days.  Has been taking tylenol and ibuprofen.  Tylenol last taken.  Patient cannot remember what time.

## 2023-02-25 ENCOUNTER — Emergency Department
Admission: EM | Admit: 2023-02-25 | Discharge: 2023-02-25 | Disposition: A | Discharge status: Home / Self Care | Payer: Federal, State, Local not specified - PPO | Attending: Emergency Medicine | Admitting: Emergency Medicine

## 2023-02-25 DIAGNOSIS — B9689 Other specified bacterial agents as the cause of diseases classified elsewhere: Secondary | ICD-10-CM | POA: Insufficient documentation

## 2023-02-25 DIAGNOSIS — Z452 Encounter for adjustment and management of vascular access device: Secondary | ICD-10-CM | POA: Diagnosis not present

## 2023-02-25 DIAGNOSIS — H5789 Other specified disorders of eye and adnexa: Secondary | ICD-10-CM | POA: Diagnosis present

## 2023-02-25 DIAGNOSIS — H1033 Unspecified acute conjunctivitis, bilateral: Secondary | ICD-10-CM | POA: Diagnosis not present

## 2023-02-25 HISTORY — DX: Disorder of thyroid, unspecified: E07.9

## 2023-02-25 HISTORY — DX: Polyneuropathy, unspecified: G62.9

## 2023-02-25 HISTORY — DX: Malignant (primary) neoplasm, unspecified: C80.1

## 2023-02-25 MED ORDER — FLUORESCEIN SODIUM 1 MG OP STRP
2.0000 | ORAL_STRIP | Freq: Once | OPHTHALMIC | Status: AC
  Administered 2023-02-25: 2 via OPHTHALMIC
  Filled 2023-02-25: qty 2

## 2023-02-25 MED ORDER — POLYMYXIN B-TRIMETHOPRIM 10000-0.1 UNIT/ML-% OP SOLN
2.0000 [drp] | Freq: Four times a day (QID) | OPHTHALMIC | Status: DC
  Administered 2023-02-25: 2 [drp] via OPHTHALMIC
  Filled 2023-02-25: qty 10

## 2023-02-25 MED ORDER — POLYMYXIN B-TRIMETHOPRIM 10000-0.1 UNIT/ML-% OP SOLN: 2.0000 [drp] | Freq: Four times a day (QID) | OPHTHALMIC | 0 refills | Status: AC

## 2023-02-25 MED ORDER — TETRACAINE HCL 0.5 % OP SOLN
2.0000 [drp] | Freq: Once | OPHTHALMIC | Status: AC
  Administered 2023-02-25: 2 [drp] via OPHTHALMIC
  Filled 2023-02-25: qty 4

## 2023-02-25 MED ORDER — HEPARIN SOD (PORK) LOCK FLUSH 10 UNIT/ML IV SOLN
10.0000 [IU] | Freq: Once | INTRAVENOUS | Status: AC
  Administered 2023-02-25: 10 [IU]
  Filled 2023-02-25: qty 5

## 2023-02-25 NOTE — ED Provider Notes (Signed)
[facility] Provider Note  Patient Contact: 10:03 PM (approximate)   History   Vascular Access Problem   HPI  Robin Stanley is a 72 y.o. female who presents the emergency ferment for 2 complaints.  Patient is a cancer patient being seen at [facility].  She had her port accessed for an infusion and then the physician decided not to perform that infusion.  Patient was discharged and they did not realize that her port was still accessed.  Patient is here for [place] to de-access her port.  Patient is also complaining of possible foreign body/infection in her eye.  Started off in the right, seems to be involving both eyes at this time.  No vision changes.     Physical Exam   Triage Vital Signs: ED Triage Vitals  Enc Vitals Group     BP 02/25/23 2026 (!) 159/54     Pulse Rate 02/25/23 2026 84     Resp 02/25/23 2026 16     Temp 02/25/23 2026 99.1 F (37.3 C)     Temp Source 02/25/23 2026 Oral     SpO2 02/25/23 2026 99 %     Weight 02/25/23 2024 194 lb (88 kg)     Height 02/25/23 2024 5\' 4"  (1.626 m)     Head Circumference --      Peak Flow --      Pain Score 02/25/23 2024 0     Pain Loc --      Pain Edu? --      Excl. in [place]? --     Most recent vital signs: Vitals:   02/25/23 2026  BP: (!) 159/54  Pulse: 84  Resp: 16  Temp: 99.1 F (37.3 C)  SpO2: 99%     General: Alert and in no acute distress. Eyes:  PERRL. EOMI. conjunctive is red bilaterally worse on the right than left.  There is mucus in both eyes.  Eyes are anesthetized using tetracaine, fluorescein staining applied no areas of uptake.   Cardiovascular:  Good peripheral perfusion Respiratory: Normal respiratory effort without tachypnea or retractions. Lungs CTAB.  Musculoskeletal: Full range of motion to all extremities.  Neurologic:  No gross focal neurologic deficits are appreciated.  Skin:   No rash noted Other: Patient has a port in the right chest wall that is still accessed.  There is no  surrounding signs of ecchymosis, edema, erythema   ED Results / Procedures / Treatments   Labs (all labs ordered are listed, but only abnormal results are displayed) Labs Reviewed - No data to display   EKG     RADIOLOGY    No results found.  PROCEDURES:  Critical Care performed: No  Procedures   MEDICATIONS ORDERED IN ED: Medications  fluorescein ophthalmic strip 2 strip (has no administration in time range)  tetracaine (PONTOCAINE) 0.5 % ophthalmic solution 2 drop (has no administration in time range)  trimethoprim-polymyxin b (POLYTRIM) ophthalmic solution 2 drop (has no administration in time range)  heparin flush 10 UNIT/ML injection 10 Units (10 Units Intracatheter Given 02/25/23 2147)     IMPRESSION / MDM / ASSESSMENT AND PLAN / ED COURSE  I reviewed the triage vital signs and the nursing notes.                                 Differential diagnosis includes, but is not limited to, for complication, cellulitis of the chest wall,  pinkeye   Patient's presentation is most consistent with acute presentation with potential threat to life or bodily function.   Patient's diagnosis is consistent with vascular access problem, conjunctivitis.  Patient presents the emergency department with her port access from [facility].  She is an oncology patient there and was scheduled for an infusion that was ultimately canceled.  Patient left and staff at [facility] did not realize that her port was still accessed.  This was de accessed and heparinized at this time.  Patient also was complaining of some bilateral eye irritation worse on the right than left.  Patient has findings consistent with conjunctivitis bilaterally.Marland Kitchen  Antibiotics drops started for her at this time.  Follow-up primary care as needed.  Patient is given ED precautions to return to the ED for any worsening or new symptoms.     FINAL CLINICAL IMPRESSION(S) / ED DIAGNOSES   Final diagnoses:  Encounter for care related to  Port-a-Cath  Acute bacterial conjunctivitis of both eyes     Rx / DC Orders   ED Discharge Orders          Ordered    trimethoprim-polymyxin b (POLYTRIM) ophthalmic solution  Every 6 hours        02/25/23 2214             Note:  This document was prepared using Dragon voice recognition software and may include unintentional dictation errors.   Lanette Hampshire 02/25/23 2215    Jene Every, MD 02/26/23 [phone]

## 2023-02-25 NOTE — ED Triage Notes (Signed)
Pt here with daughter arrived POV after chemo treatment today at [facility]. Pt reports they forgot to remove her cath from port after treatment. Pt reports no other concerns. Only here for cath removal.
